# Patient Record
Sex: Female | Born: 1965 | Race: Black or African American | Hispanic: No | Marital: Married | Smoking: Never smoker
Health system: Southern US, Community
[De-identification: ages and names within clinical notes are randomized; demographics above are authoritative.]

## PROBLEM LIST (undated history)

## (undated) DIAGNOSIS — I10 Essential (primary) hypertension: Secondary | ICD-10-CM

## (undated) DIAGNOSIS — J301 Allergic rhinitis due to pollen: Secondary | ICD-10-CM

## (undated) DIAGNOSIS — E785 Hyperlipidemia, unspecified: Secondary | ICD-10-CM

## (undated) DIAGNOSIS — D649 Anemia, unspecified: Secondary | ICD-10-CM

## (undated) DIAGNOSIS — M1990 Unspecified osteoarthritis, unspecified site: Secondary | ICD-10-CM

## (undated) DIAGNOSIS — T7840XA Allergy, unspecified, initial encounter: Secondary | ICD-10-CM

## (undated) DIAGNOSIS — F32A Depression, unspecified: Secondary | ICD-10-CM

## (undated) DIAGNOSIS — N289 Disorder of kidney and ureter, unspecified: Secondary | ICD-10-CM

## (undated) DIAGNOSIS — K219 Gastro-esophageal reflux disease without esophagitis: Secondary | ICD-10-CM

## (undated) DIAGNOSIS — E7800 Pure hypercholesterolemia, unspecified: Secondary | ICD-10-CM

## (undated) DIAGNOSIS — B019 Varicella without complication: Secondary | ICD-10-CM

## (undated) HISTORY — DX: Allergy, unspecified, initial encounter: T78.40XA

## (undated) HISTORY — DX: Depression, unspecified: F32.A

## (undated) HISTORY — DX: Pure hypercholesterolemia, unspecified: E78.00

## (undated) HISTORY — DX: Essential (primary) hypertension: I10

## (undated) HISTORY — DX: Hyperlipidemia, unspecified: E78.5

## (undated) HISTORY — DX: Varicella without complication: B01.9

## (undated) HISTORY — DX: Gastro-esophageal reflux disease without esophagitis: K21.9

## (undated) HISTORY — DX: Disorder of kidney and ureter, unspecified: N28.9

## (undated) HISTORY — DX: Allergic rhinitis due to pollen: J30.1

## (undated) HISTORY — DX: Unspecified osteoarthritis, unspecified site: M19.90

## (undated) HISTORY — DX: Anemia, unspecified: D64.9

---

## 2002-09-11 ENCOUNTER — Ambulatory Visit (HOSPITAL_COMMUNITY): Admission: RE | Admit: 2002-09-11 | Discharge: 2002-09-11 | Payer: Self-pay | Admitting: Obstetrics

## 2002-09-11 ENCOUNTER — Encounter: Payer: Self-pay | Admitting: Obstetrics

## 2005-08-29 HISTORY — PX: ABDOMINAL HYSTERECTOMY: SHX81

## 2005-12-29 ENCOUNTER — Inpatient Hospital Stay (HOSPITAL_COMMUNITY): Admission: RE | Admit: 2005-12-29 | Discharge: 2005-12-31 | Payer: Self-pay | Admitting: Obstetrics

## 2009-08-29 DIAGNOSIS — N289 Disorder of kidney and ureter, unspecified: Secondary | ICD-10-CM

## 2009-08-29 HISTORY — DX: Disorder of kidney and ureter, unspecified: N28.9

## 2010-12-03 DIAGNOSIS — I10 Essential (primary) hypertension: Secondary | ICD-10-CM | POA: Insufficient documentation

## 2010-12-03 DIAGNOSIS — N183 Chronic kidney disease, stage 3 unspecified: Secondary | ICD-10-CM | POA: Insufficient documentation

## 2012-12-07 DIAGNOSIS — R1013 Epigastric pain: Secondary | ICD-10-CM | POA: Insufficient documentation

## 2014-05-09 ENCOUNTER — Encounter: Payer: Self-pay | Admitting: Gynecology

## 2014-05-16 ENCOUNTER — Encounter: Payer: Self-pay | Admitting: Gynecology

## 2014-06-04 ENCOUNTER — Encounter: Payer: Self-pay | Admitting: Gynecology

## 2014-06-04 ENCOUNTER — Ambulatory Visit (INDEPENDENT_AMBULATORY_CARE_PROVIDER_SITE_OTHER): Payer: BC Managed Care – PPO | Admitting: Gynecology

## 2014-06-04 VITALS — BP 126/64 | HR 100 | Resp 16 | Ht 62.25 in | Wt 190.0 lb

## 2014-06-04 DIAGNOSIS — N898 Other specified noninflammatory disorders of vagina: Secondary | ICD-10-CM

## 2014-06-04 DIAGNOSIS — Z0000 Encounter for general adult medical examination without abnormal findings: Secondary | ICD-10-CM

## 2014-06-04 DIAGNOSIS — Z01419 Encounter for gynecological examination (general) (routine) without abnormal findings: Secondary | ICD-10-CM

## 2014-06-04 DIAGNOSIS — Q524 Other congenital malformations of vagina: Secondary | ICD-10-CM

## 2014-06-04 DIAGNOSIS — Q505 Embryonic cyst of broad ligament: Secondary | ICD-10-CM

## 2014-06-04 LAB — POCT WET PREP (WET MOUNT): Clue Cells Wet Prep Whiff POC: POSITIVE

## 2014-06-04 LAB — POCT URINALYSIS DIPSTICK
Bilirubin, UA: NEGATIVE
Blood, UA: NEGATIVE
Glucose, UA: NEGATIVE
Ketones, UA: NEGATIVE
Leukocytes, UA: NEGATIVE
Nitrite, UA: NEGATIVE
Protein, UA: NEGATIVE
Urobilinogen, UA: NEGATIVE
pH, UA: 5

## 2014-06-04 LAB — HEMOGLOBIN, FINGERSTICK: Hemoglobin, fingerstick: 12.6 g/dL (ref 12.0–16.0)

## 2014-06-04 MED ORDER — NONFORMULARY OR COMPOUNDED ITEM: Status: DC

## 2014-06-04 NOTE — Progress Notes (Signed)
48 y.o. Married [place] American female  G2P2 here for annual exam. Pt is currently sexually active.  Pt has a TAH years ago.  Pt reports a "cyst" at the opening of the vagina for years.  Pt also reports some d/c and itching.  Pt with recurrent BV in the past.  Pt no longer douches, rare tub baths.  No abnormal pap.  Pt denies hot flashes, night sweats.  Patient's last menstrual period was 11/27/2005.          Sexually active: Yes.    The current method of family planning is status post hysterectomy.    Exercising: No.  The patient does not participate in regular exercise at present. Last pap: 2007 neg Alcohol: No Tobacco: No BSE: Sometimes MMG: 2004 BIRADS1: neg    Health Maintenance  Topic Date Due  . Pap Smear  12/06/1984  . Tetanus/tdap  12/06/1985  . Influenza Vaccine  03/29/2014    Family History  Problem Relation Age of Onset  . Hypertension Mother   . Hypertension Father     There are no active problems to display for this patient.   Past Medical History  Diagnosis Date  . Kidney disease 2011  . Hypertension     Past Surgical History  Procedure Laterality Date  . Cesarean section    . Abdominal hysterectomy  2007    Allergies: Amlodipine; Doxazosin; and Lisinopril  Current Outpatient Prescriptions  Medication Sig Dispense Refill  . fexofenadine (ALLEGRA) 30 MG tablet Take 30 mg by mouth 2 (two) times daily.      Marland Kitchen losartan-hydrochlorothiazide (HYZAAR) 100-25 MG per tablet TAKE ONE TABLET BY MOUTH ONE TIME DAILY      . spironolactone (ALDACTONE) 50 MG tablet        No current facility-administered medications for this visit.    ROS: Pertinent items are noted in HPI.  Exam:    BP 126/64  Pulse 100  Resp 16  Ht 5' 2.25" (1.581 m)  Wt 190 lb (86.183 kg)  BMI 34.48 kg/m2  LMP 11/27/2005 Weight change: @WEIGHTCHANGE @ Last 3 height recordings:  Ht Readings from Last 3 Encounters:  06/04/14 5' 2.25" (1.581 m)   General appearance: alert, cooperative  and appears stated age Head: Normocephalic, without obvious abnormality, atraumatic Neck: no adenopathy, no carotid bruit, no JVD, supple, symmetrical, trachea midline and thyroid not enlarged, symmetric, no tenderness/mass/nodules Lungs: clear to auscultation bilaterally Breasts: normal appearance, no masses or tenderness Heart: regular rate and rhythm, S1, S2 normal, no murmur, click, rub or gallop Abdomen: soft, non-tender; bowel sounds normal; no masses,  no organomegaly Extremities: extremities normal, atraumatic, no cyanosis or edema Skin: Skin color, texture, turgor normal. No rashes or lesions Lymph nodes: Cervical, supraclavicular, and axillary nodes normal. no inguinal nodes palpated Neurologic: Grossly normal   Pelvic: External genitalia:  no lesions              Urethra: normal appearing urethra with no masses, tenderness or lesions              Bartholins and Skenes: Bartholin's, Urethra, Skene's normal                 Vagina: vaginal discharge - white and scant, right sided gartner duct cyst, mobile non-tender              Cervix: absent              Pap taken: No.        Bimanual Exam:  Uterus:  absent                                      Adnexa:    no masses                                      Rectovaginal: Confirms                                      Anus:  normal sphincter tone, no lesions       1. Laboratory exam ordered as part of routine general medical examination  - POCT Urinalysis Dipstick - Hemoglobin, fingerstick  2. Encounter for routine gynecological examination  mammogram stressed! counseled on breast self exam, mammography screening, feminine hygiene, adequate intake of calcium and vitamin D, diet and exercise return annually or prn  3. Vaginal discharge Informed no clue cells but many epithelials noted,  1x vinegar douche then boric acid capsules pv F/u 2w for test of cure - POCT Wet Prep [facility] [place]) - NONFORMULARY OR COMPOUNDED ITEM; 1 per  vagina nightly every 3d, or after sex  Dispense: 61 each; Refill: 0  4. Gartner duct, cyst Pt informed, nature of cyst reviewed Not bothersome, recommend not removing   An After Visit Summary was printed and given to the patient.

## 2014-06-05 ENCOUNTER — Other Ambulatory Visit: Payer: Self-pay | Admitting: Gynecology

## 2014-06-05 ENCOUNTER — Telehealth: Payer: Self-pay

## 2014-06-05 DIAGNOSIS — Z1231 Encounter for screening mammogram for malignant neoplasm of breast: Secondary | ICD-10-CM

## 2014-06-05 NOTE — Telephone Encounter (Signed)
Spoke with patient. Patient states that Dr.Lathrop already sent rx to [facility] yesterday and she has picked up the rx.  Routing to provider for final review. Patient agreeable to disposition. Will close encounter

## 2014-06-05 NOTE — Telephone Encounter (Signed)
Spoke with [place] at time of incoming call. They state that electronic rx sent for patient does not specify what medication. Advised patient will need Boric acid capsules. [facility] in [place] is not a Oncologist. States the closest one is in [place] and is called [facility]. Will need to speak with patient and see if she wants rx sent to [facility] or compounding pharmacy in [place].

## 2014-06-05 NOTE — Telephone Encounter (Signed)
Pt is calling kaitlyn back

## 2014-06-05 NOTE — Telephone Encounter (Signed)
Left message to call Kaitlyn at [phone]. 

## 2014-06-20 ENCOUNTER — Ambulatory Visit (INDEPENDENT_AMBULATORY_CARE_PROVIDER_SITE_OTHER): Payer: BC Managed Care – PPO | Admitting: Gynecology

## 2014-06-20 ENCOUNTER — Ambulatory Visit (HOSPITAL_COMMUNITY)
Admission: RE | Admit: 2014-06-20 | Discharge: 2014-06-20 | Disposition: A | Discharge status: Home / Self Care | Payer: BC Managed Care – PPO | Source: Ambulatory Visit | Attending: Gynecology | Admitting: Gynecology

## 2014-06-20 ENCOUNTER — Encounter: Payer: Self-pay | Admitting: Gynecology

## 2014-06-20 VITALS — BP 112/72 | HR 68 | Resp 16 | Ht 62.25 in | Wt 192.0 lb

## 2014-06-20 DIAGNOSIS — Z1231 Encounter for screening mammogram for malignant neoplasm of breast: Secondary | ICD-10-CM | POA: Diagnosis not present

## 2014-06-20 DIAGNOSIS — N898 Other specified noninflammatory disorders of vagina: Secondary | ICD-10-CM

## 2014-06-20 MED ORDER — NONFORMULARY OR COMPOUNDED ITEM: Status: DC

## 2014-06-20 NOTE — Progress Notes (Signed)
Subjective:     Patient ID: Carol [name], female   DOB: 1966/07/18, 48 y.o.   MRN: [id number]  HPI Comments: Pt here for test of cure for vaginal discharge,  She was asked to do 1x vinegar douche and then start boric acid.  Pt states that she no longer has any discharge.  She has not had sex since starting medication    Review of Systems Per HPI    Objective:   Physical Exam  Nursing note and vitals reviewed. Constitutional: She appears well-developed and well-nourished.  Genitourinary: There is lesion (garner duct cyst) on the right labia. No erythema around the vagina. No vaginal discharge found.  No vaginal discharge Ph 4.5 Whiff negative Insufficient discharge for full wet prep       Assessment:     Recurrent BV     Plan:     Doing well with current management Refills of Boric acid sent in as needed, will see if local [facility] can make Questions addressed

## 2014-06-30 ENCOUNTER — Encounter: Payer: Self-pay | Admitting: Gynecology

## 2015-02-18 ENCOUNTER — Telehealth: Payer: Self-pay

## 2015-02-18 NOTE — Telephone Encounter (Signed)
Error.  Misunderstood patient on phone. Opened Wrong Chart.

## 2015-05-26 ENCOUNTER — Other Ambulatory Visit: Payer: Self-pay

## 2015-05-26 DIAGNOSIS — Z1231 Encounter for screening mammogram for malignant neoplasm of breast: Secondary | ICD-10-CM

## 2015-06-24 ENCOUNTER — Encounter: Payer: Self-pay | Admitting: Obstetrics and Gynecology

## 2015-06-24 ENCOUNTER — Ambulatory Visit (INDEPENDENT_AMBULATORY_CARE_PROVIDER_SITE_OTHER): Payer: BLUE CROSS/BLUE SHIELD | Admitting: Obstetrics and Gynecology

## 2015-06-24 ENCOUNTER — Ambulatory Visit
Admission: RE | Admit: 2015-06-24 | Discharge: 2015-06-24 | Disposition: A | Discharge status: Home / Self Care | Payer: BLUE CROSS/BLUE SHIELD | Source: Ambulatory Visit

## 2015-06-24 VITALS — BP 128/80 | HR 84 | Resp 14 | Ht 62.5 in | Wt 190.0 lb

## 2015-06-24 DIAGNOSIS — Z01419 Encounter for gynecological examination (general) (routine) without abnormal findings: Secondary | ICD-10-CM

## 2015-06-24 DIAGNOSIS — Z23 Encounter for immunization: Secondary | ICD-10-CM

## 2015-06-24 DIAGNOSIS — Z1231 Encounter for screening mammogram for malignant neoplasm of breast: Secondary | ICD-10-CM

## 2015-06-24 DIAGNOSIS — N898 Other specified noninflammatory disorders of vagina: Secondary | ICD-10-CM

## 2015-06-24 NOTE — Patient Instructions (Signed)

## 2015-06-24 NOTE — Progress Notes (Signed)
Patient ID: Carol [name], female   DOB: 10-21-1965, 49 y.o.   MRN: [id number] 49 y.o. [id number] MarriedAfrican AmericanF here for annual exam.  H/O TAH. Every now and then she notices a watery d/c, she has been using haylafem intermittently, most of the time 2 x a week. No itching, burning or irritation. Occasional mild odor, musty. She feels intermittently bloated. BM mostly 1 x a day, can go 2 days in between BM's. No vaginal bleeding. Sexually active, no pain. Occasional vaginal dryness. Occasionally hot at night, no hot flashes or night sweats.     Patient's last menstrual period was 11/27/2005.          Sexually active: Yes.    The current method of family planning is status post hysterectomy.    Exercising: Yes.    walking Smoker:  no  Health Maintenance: Pap:  2007 WNL History of abnormal Pap:  no MMG:  06-20-14 WNL  Colonoscopy:  Never BMD:   Never TDaP:  unsure Gardasil: N/A   reports that she has never smoked. She has never used smokeless tobacco. She reports that she does not drink alcohol or use illicit drugs.  Past Medical History  Diagnosis Date  . Kidney disease 2011  . Hypertension   She see's a nephrologist  Past Surgical History  Procedure Laterality Date  . Cesarean section    . Abdominal hysterectomy  2007    Current Outpatient Prescriptions  Medication Sig Dispense Refill  . fexofenadine (ALLEGRA) 30 MG tablet Take 30 mg by mouth as needed.     Marland Kitchen losartan-hydrochlorothiazide (HYZAAR) 100-25 MG per tablet TAKE ONE TABLET BY MOUTH ONE TIME DAILY    . NONFORMULARY OR COMPOUNDED ITEM 1 per vagina nightly every 3d, or after sex 31 each 10  . spironolactone (ALDACTONE) 50 MG tablet      No current facility-administered medications for this visit.    Family History  Problem Relation Age of Onset  . Hypertension Mother   . Hypertension Father     Review of Systems  Constitutional: Positive for chills and unexpected weight change.  HENT: Positive for sinus  pressure.   Eyes: Negative.   Respiratory: Negative.   Cardiovascular: Negative.   Gastrointestinal: Negative.        Bloating  Endocrine: Negative.   Genitourinary: Negative.   Musculoskeletal: Negative.   Skin: Negative.   Allergic/Immunologic: Negative.   Neurological: Negative.   Psychiatric/Behavioral: Negative.   Error above, she has gained a couple of pounds. Occasionally gets chilly  Exam:   BP 128/80 mmHg  Pulse 84  Resp 14  Ht 5' 2.5" (1.588 m)  Wt 190 lb (86.183 kg)  BMI 34.18 kg/m2  LMP 11/27/2005  Weight change: @WEIGHTCHANGE @ Height:   Height: 5' 2.5" (158.8 cm)  Ht Readings from Last 3 Encounters:  06/24/15 5' 2.5" (1.588 m)  06/20/14 5' 2.25" (1.581 m)  06/04/14 5' 2.25" (1.581 m)    General appearance: alert, cooperative and appears stated age Head: Normocephalic, without obvious abnormality, atraumatic Neck: no adenopathy, supple, symmetrical, trachea midline and thyroid normal to inspection and palpation Lungs: clear to auscultation bilaterally Breasts: normal appearance, no masses or tenderness Heart: regular rate and rhythm Abdomen: soft, non-tender; bowel sounds normal; no masses,  no organomegaly Extremities: extremities normal, atraumatic, no cyanosis or edema Skin: Skin color, texture, turgor normal. No rashes or lesions Lymph nodes: Cervical, supraclavicular, and axillary nodes normal. No abnormal inguinal nodes palpated Neurologic: Grossly normal   Pelvic: External genitalia:  no  lesions              Urethra:  normal appearing urethra with no masses, tenderness or lesions              Bartholins and Skenes: normal                 Vagina: normal appearing, slightly dry, still appears estrogenized, some thick white d/c on the wall of the vagina, may be                Cervix: absent               Bimanual Exam:  Uterus:  uterus absent              Adnexa: no mass, fullness, tenderness               Rectovaginal: Confirms               Anus:   normal sphincter tone, no lesions  Chaperone was present for exam.  Wet prep: no clue, no trich, few WBC, few para basilar cells KOH: negative for yeast PH: 4  A:  Well Woman with normal exam  Intermittent vaginal d/c, using hylafem off and on for the last year  Immunization: TDAP due  P:   No pap needed  Mammogram scheduled next week  Negative vaginal slides  Wet prep probe sent  Advised to stop using the hylafem and come in with symptoms  TDAP today

## 2015-06-25 ENCOUNTER — Telehealth: Payer: Self-pay | Admitting: Emergency Medicine

## 2015-06-25 LAB — WET PREP BY MOLECULAR PROBE
Candida species: NEGATIVE
GARDNERELLA VAGINALIS: NEGATIVE
Trichomonas vaginosis: NEGATIVE

## 2015-06-25 NOTE — Telephone Encounter (Signed)
-----   Message from Salvadore Dom, MD sent at 06/24/2015  3:49 PM EDT ----- Please check the results of her wet prep probe tomorrow. Thanks!!

## 2015-06-25 NOTE — Telephone Encounter (Signed)
Message left to return call to [place] at [phone].   Affirm negative.

## 2015-06-25 NOTE — Telephone Encounter (Signed)
Call to patient to advise of negative wet prep affirm testing. Patient states she will follow instructions from Dr. Talbert Nan and monitor symptoms and return call as needed. Routing to provider for final review. Patient agreeable to disposition. Will close encounter.

## 2015-06-26 ENCOUNTER — Telehealth: Payer: Self-pay

## 2015-06-26 NOTE — Telephone Encounter (Signed)
-----   Message from Nunzio Cobbs, MD sent at 06/26/2015  5:50 AM EDT ----- Please inform of negative Affirm testing.  Dr. Talbert Nan has advised to stop doing scheduled Hylafem and please call the office for an appointment if she having symptoms of vaginitis.

## 2015-06-26 NOTE — Telephone Encounter (Signed)
Michele Mcalpine, RN at 06/25/2015 4:14 PM     Status: Signed       Expand All Collapse All   Call to patient to advise of negative wet prep affirm testing. Patient states she will follow instructions from Dr. Talbert Nan and monitor symptoms and return call as needed. Routing to provider for final review. Patient agreeable to disposition. Will close encounter.                Michele Mcalpine, RN at 06/25/2015 10:30 AM     Status: Signed       Expand All Collapse All   Message left to return call to [place] at [phone].   Affirm negative.              Michele Mcalpine, RN at 06/25/2015 10:29 AM     Status: Signed       Expand All Collapse All   ----- Message from Salvadore Dom, MD sent at 06/24/2015 3:49 PM EDT ----- Please check the results of her wet prep probe tomorrow. Thanks!!       Routing to provider for final review. Patient agreeable to disposition. Will close encounter.

## 2015-06-30 ENCOUNTER — Other Ambulatory Visit: Payer: Self-pay | Admitting: Obstetrics and Gynecology

## 2015-06-30 DIAGNOSIS — R928 Other abnormal and inconclusive findings on diagnostic imaging of breast: Secondary | ICD-10-CM

## 2015-07-06 ENCOUNTER — Ambulatory Visit
Admission: RE | Admit: 2015-07-06 | Discharge: 2015-07-06 | Disposition: A | Discharge status: Home / Self Care | Payer: BLUE CROSS/BLUE SHIELD | Source: Ambulatory Visit | Attending: Obstetrics and Gynecology | Admitting: Obstetrics and Gynecology

## 2015-07-06 DIAGNOSIS — R928 Other abnormal and inconclusive findings on diagnostic imaging of breast: Secondary | ICD-10-CM

## 2015-08-27 ENCOUNTER — Ambulatory Visit (INDEPENDENT_AMBULATORY_CARE_PROVIDER_SITE_OTHER): Payer: BLUE CROSS/BLUE SHIELD | Admitting: Obstetrics and Gynecology

## 2015-08-27 ENCOUNTER — Encounter: Payer: Self-pay | Admitting: Obstetrics and Gynecology

## 2015-08-27 VITALS — BP 122/80 | HR 88 | Resp 16 | Wt 192.0 lb

## 2015-08-27 DIAGNOSIS — N898 Other specified noninflammatory disorders of vagina: Secondary | ICD-10-CM

## 2015-08-27 DIAGNOSIS — L293 Anogenital pruritus, unspecified: Secondary | ICD-10-CM | POA: Diagnosis not present

## 2015-08-27 NOTE — Progress Notes (Signed)
Patient ID: Carol [name], female   DOB: 07/11/1966, 49 y.o.   MRN: [id number] GYNECOLOGY  VISIT   HPI: 49 y.o.   Married  [place] American  female   [phone] with Patient's last menstrual period was 11/27/2005.   here c/o vaginal itching and discharge for a few weeks, may be getting slightly better. Itching is mild. No burning. Discharge is thick but can be watery, clear. Possible mild odor.   GYNECOLOGIC HISTORY: Patient's last menstrual period was 11/27/2005. Contraception:Hysterectomy  Menopausal hormone therapy: None         OB History    Gravida Para Term Preterm AB TAB SAB Ectopic Multiple Living   2 2 2       2          There are no active problems to display for this patient.   Past Medical History  Diagnosis Date  . Kidney disease 2011  . Hypertension     Past Surgical History  Procedure Laterality Date  . Cesarean section    . Abdominal hysterectomy  2007    Current Outpatient Prescriptions  Medication Sig Dispense Refill  . fexofenadine (ALLEGRA) 30 MG tablet Take 30 mg by mouth as needed.     Marland Kitchen losartan-hydrochlorothiazide (HYZAAR) 100-25 MG per tablet TAKE ONE TABLET BY MOUTH ONE TIME DAILY    . spironolactone (ALDACTONE) 50 MG tablet     . NONFORMULARY OR COMPOUNDED ITEM 1 per vagina nightly every 3d, or after sex (Patient not taking: Reported on 08/27/2015) 30 each 10   No current facility-administered medications for this visit.     ALLERGIES: Amlodipine; Doxazosin; and Lisinopril  Family History  Problem Relation Age of Onset  . Hypertension Mother   . Hypertension Father     Social History   Social History  . Marital Status: Married    Spouse Name: N/A  . Number of Children: N/A  . Years of Education: N/A   Occupational History  . Not on file.   Social History Main Topics  . Smoking status: Never Smoker   . Smokeless tobacco: Never Used  . Alcohol Use: No  . Drug Use: No  . Sexual Activity:    Partners: Male    Birth Control/  Protection: Surgical   Other Topics Concern  . Not on file   Social History Narrative    Review of Systems  Constitutional: Negative.   HENT: Negative.   Eyes: Negative.   Respiratory: Negative.   Cardiovascular: Negative.   Gastrointestinal: Negative.   Genitourinary:       Vaginal discharge and itching   Musculoskeletal: Negative.   Skin: Negative.   Neurological: Negative.   Endo/Heme/Allergies: Negative.   Psychiatric/Behavioral: Negative.     PHYSICAL EXAMINATION:    BP 122/80 mmHg  Pulse 88  Resp 16  Wt 192 lb (87.091 kg)  LMP 11/27/2005    General appearance: alert, cooperative and appears stated age  Pelvic: External genitalia:  no lesions, mild erythema              Urethra:  normal appearing urethra with no masses, tenderness or lesions              Bartholins and Skenes: normal                 Vagina: normal appearing vagina with a thick, clumpy white vaginal discharge.               Cervix: no lesions  Chaperone was present for exam.   Wet prep: ? clue, no trich, rare wbc KOH: no yeast PH: 5   ASSESSMENT Vaginal discharge Genital pruritus Vaginal slides not conclusive, concerning for BV, exam more cw yeast    PLAN Send wet prep probe, will treat accordingly   An After Visit Summary was printed and given to the patient.

## 2015-08-27 NOTE — Patient Instructions (Signed)

## 2015-08-28 ENCOUNTER — Other Ambulatory Visit: Payer: Self-pay | Admitting: Obstetrics & Gynecology

## 2015-08-28 LAB — WET PREP BY MOLECULAR PROBE
CANDIDA SPECIES: NEGATIVE
Gardnerella vaginalis: POSITIVE — AB
Trichomonas vaginosis: NEGATIVE

## 2015-08-28 MED ORDER — METRONIDAZOLE 500 MG PO TABS: 500.0000 mg | ORAL_TABLET | Freq: Two times a day (BID) | ORAL | Status: DC

## 2015-10-20 ENCOUNTER — Other Ambulatory Visit: Payer: Self-pay | Admitting: Obstetrics & Gynecology

## 2015-10-21 NOTE — Telephone Encounter (Signed)
Please have the patient come in for evaluation. If we document 3 cases of BV we would treat her with suppression. Of her last 2 wet prep probes for symptoms, only one was + for BV.

## 2015-10-21 NOTE — Telephone Encounter (Signed)
Medication refill request: Flagyl Last AEX:  06/24/15 JJ Next AEX: 06/27/16 JJ Last MMG (if hormonal medication request): 07/06/15 BIRADS Category 2 Benign Refill authorized: 08/28/15 #14 tabs 0 Refills  Today: #14 tabs 0 Refills ? Please advise

## 2015-10-22 ENCOUNTER — Telehealth: Payer: Self-pay | Admitting: Emergency Medicine

## 2015-10-22 NOTE — Telephone Encounter (Signed)
Message left to return call to Korbyn Vanes at [phone].    

## 2015-10-22 NOTE — Telephone Encounter (Signed)
Message left to return call to Carol [name] at [phone].    

## 2015-10-28 NOTE — Telephone Encounter (Signed)
Patient returned call. She requested treatment via pharmacy request for bacterial vaginosis. Patient states she has intermittent vaginal odor that has started when she has started working out at [employer]. It is not constant and there is thin clear vaginal discharge. Patient feels this is not any different than previous bacterial vaginosis infections.  Denies fevers or pelvic pain. No STD concerns.  Patient offered office visit with Dr. Talbert Nan for evaluation and she declines. She states she will call back if she chooses to schedule an office visit.  She is advised to call back with any concerns and is agreeable. Routing to provider for final review. Patient agreeable to disposition. Will close encounter.

## 2015-11-02 ENCOUNTER — Ambulatory Visit (INDEPENDENT_AMBULATORY_CARE_PROVIDER_SITE_OTHER): Payer: BLUE CROSS/BLUE SHIELD | Admitting: Obstetrics and Gynecology

## 2015-11-02 ENCOUNTER — Encounter: Payer: Self-pay | Admitting: Obstetrics and Gynecology

## 2015-11-02 VITALS — BP 124/72 | HR 88 | Resp 14 | Wt 188.0 lb

## 2015-11-02 DIAGNOSIS — N9489 Other specified conditions associated with female genital organs and menstrual cycle: Secondary | ICD-10-CM | POA: Diagnosis not present

## 2015-11-02 DIAGNOSIS — N898 Other specified noninflammatory disorders of vagina: Secondary | ICD-10-CM

## 2015-11-02 NOTE — Progress Notes (Signed)
Patient ID: Carol [name], female   DOB: Jan 11, 1966, 50 y.o.   MRN: [id number] GYNECOLOGY  VISIT   HPI: 50 y.o.   Married  [place] American  female   [phone] with Patient's last menstrual period was 11/27/2005.   here c/o vaginal discharge and slight odor for the last 3 weeks. The D/C is an increased clear d/c. She notices the odor more if she works out. She is sexually active, not sure the odor is worse after intercourse.  The patient was treated for BV 2 months ago. t  GYNECOLOGIC HISTORY: Patient's last menstrual period was 11/27/2005. Contraception:postmenopause Menopausal hormone therapy: none         OB History    Gravida Para Term Preterm AB TAB SAB Ectopic Multiple Living   2 2 2       2          There are no active problems to display for this patient.   Past Medical History  Diagnosis Date  . Kidney disease 2011  . Hypertension     Past Surgical History  Procedure Laterality Date  . Cesarean section    . Abdominal hysterectomy  2007    Current Outpatient Prescriptions  Medication Sig Dispense Refill  . fexofenadine (ALLEGRA) 30 MG tablet Take 30 mg by mouth as needed.     Marland Kitchen losartan-hydrochlorothiazide (HYZAAR) 100-25 MG per tablet TAKE ONE TABLET BY MOUTH ONE TIME DAILY    . spironolactone (ALDACTONE) 50 MG tablet      No current facility-administered medications for this visit.     ALLERGIES: Amlodipine; Doxazosin; and Lisinopril  Family History  Problem Relation Age of Onset  . Hypertension Mother   . Hypertension Father     Social History   Social History  . Marital Status: Married    Spouse Name: N/A  . Number of Children: N/A  . Years of Education: N/A   Occupational History  . Not on file.   Social History Main Topics  . Smoking status: Never Smoker   . Smokeless tobacco: Never Used  . Alcohol Use: No  . Drug Use: No  . Sexual Activity:    Partners: Male    Birth Control/ Protection: Surgical   Other Topics Concern  . Not on file    Social History Narrative    Review of Systems  Constitutional: Negative.   HENT: Negative.   Eyes: Negative.   Respiratory: Negative.   Cardiovascular: Negative.   Gastrointestinal: Negative.   Genitourinary:       Vaginal discharge Odor Irritation   Musculoskeletal: Negative.   Skin: Negative.   Neurological: Negative.   Endo/Heme/Allergies: Negative.   Psychiatric/Behavioral: Negative.     PHYSICAL EXAMINATION:    BP 124/72 mmHg  Pulse 88  Resp 14  Wt 188 lb (85.276 kg)  LMP 11/27/2005    General appearance: alert, cooperative and appears stated age  Pelvic: External genitalia:  no lesions              Urethra:  normal appearing urethra with no masses, tenderness or lesions              Bartholins and Skenes: normal                 Vagina: normal appearing vagina with normal color and discharge, no lesions              Cervix:absent  Chaperone was present for exam.  Wet prep: ? clue, no trich, ++ wbc KOH:  no yeast PH: 4  ASSESSMENT Increased vaginal d/c with an odor. Vaginal  Slides are unclear  PLAN Wet prep probe sent Will treat accordingly Discussed that condoms can decrease the risk of BV We discussed that some women have more vaginal d/c than others Patient requested a list of names of primary MD's, information given   An After Visit Summary was printed and given to the patient.  15 minutes face to face time of which over 50% was spent in counseling.

## 2015-11-03 ENCOUNTER — Telehealth: Payer: Self-pay

## 2015-11-03 LAB — WET PREP BY MOLECULAR PROBE
CANDIDA SPECIES: NEGATIVE
Gardnerella vaginalis: NEGATIVE
Trichomonas vaginosis: NEGATIVE

## 2015-11-03 NOTE — Telephone Encounter (Signed)
Spoke with patient. Advised of results as seen below from [place]. She verbalizes understanding.  Routing to provider for final review. Patient agreeable to disposition. Will close encounter.

## 2015-11-03 NOTE — Telephone Encounter (Signed)
-----   Message from Carol Dom, MD sent at 11/03/2015 12:55 PM EST ----- Please advise the patient of normal results.

## 2016-06-01 ENCOUNTER — Other Ambulatory Visit: Payer: Self-pay | Admitting: Obstetrics and Gynecology

## 2016-06-01 DIAGNOSIS — Z1231 Encounter for screening mammogram for malignant neoplasm of breast: Secondary | ICD-10-CM

## 2016-06-27 ENCOUNTER — Ambulatory Visit
Admission: RE | Admit: 2016-06-27 | Discharge: 2016-06-27 | Disposition: A | Discharge status: Home / Self Care | Payer: BLUE CROSS/BLUE SHIELD | Source: Ambulatory Visit | Attending: Obstetrics and Gynecology | Admitting: Obstetrics and Gynecology

## 2016-06-27 ENCOUNTER — Ambulatory Visit (INDEPENDENT_AMBULATORY_CARE_PROVIDER_SITE_OTHER): Payer: BLUE CROSS/BLUE SHIELD | Admitting: Obstetrics and Gynecology

## 2016-06-27 ENCOUNTER — Encounter: Payer: Self-pay | Admitting: Obstetrics and Gynecology

## 2016-06-27 VITALS — BP 148/80 | HR 84 | Resp 14 | Ht 62.25 in | Wt 190.0 lb

## 2016-06-27 DIAGNOSIS — B372 Candidiasis of skin and nail: Secondary | ICD-10-CM | POA: Diagnosis not present

## 2016-06-27 DIAGNOSIS — R194 Change in bowel habit: Secondary | ICD-10-CM

## 2016-06-27 DIAGNOSIS — R350 Frequency of micturition: Secondary | ICD-10-CM | POA: Diagnosis not present

## 2016-06-27 DIAGNOSIS — Z0000 Encounter for general adult medical examination without abnormal findings: Secondary | ICD-10-CM | POA: Diagnosis not present

## 2016-06-27 DIAGNOSIS — Z1231 Encounter for screening mammogram for malignant neoplasm of breast: Secondary | ICD-10-CM

## 2016-06-27 DIAGNOSIS — R1030 Lower abdominal pain, unspecified: Secondary | ICD-10-CM

## 2016-06-27 DIAGNOSIS — Z01419 Encounter for gynecological examination (general) (routine) without abnormal findings: Secondary | ICD-10-CM | POA: Diagnosis not present

## 2016-06-27 DIAGNOSIS — N898 Other specified noninflammatory disorders of vagina: Secondary | ICD-10-CM

## 2016-06-27 LAB — CBC
HEMATOCRIT: 35.4 % (ref 35.0–45.0)
HEMOGLOBIN: 11.5 g/dL — AB (ref 11.7–15.5)
MCH: 23.3 pg — ABNORMAL LOW (ref 27.0–33.0)
MCHC: 32.5 g/dL (ref 32.0–36.0)
MCV: 71.8 fL — ABNORMAL LOW (ref 80.0–100.0)
MPV: 9.4 fL (ref 7.5–12.5)
Platelets: 394 10*3/uL (ref 140–400)
RBC: 4.93 MIL/uL (ref 3.80–5.10)
RDW: 15.4 % — ABNORMAL HIGH (ref 11.0–15.0)
WBC: 10.8 10*3/uL (ref 3.8–10.8)

## 2016-06-27 LAB — POCT URINALYSIS DIPSTICK
BILIRUBIN UA: NEGATIVE
Blood, UA: NEGATIVE
Glucose, UA: NEGATIVE
KETONES UA: NEGATIVE
LEUKOCYTES UA: NEGATIVE
Nitrite, UA: NEGATIVE
Protein, UA: NEGATIVE
Urobilinogen, UA: NEGATIVE
pH, UA: 6

## 2016-06-27 LAB — COMPREHENSIVE METABOLIC PANEL
ALBUMIN: 3.9 g/dL (ref 3.6–5.1)
ALK PHOS: 55 U/L (ref 33–115)
ALT: 14 U/L (ref 6–29)
AST: 14 U/L (ref 10–35)
BILIRUBIN TOTAL: 0.3 mg/dL (ref 0.2–1.2)
BUN: 12 mg/dL (ref 7–25)
CALCIUM: 9.7 mg/dL (ref 8.6–10.2)
CO2: 26 mmol/L (ref 20–31)
CREATININE: 1.15 mg/dL — AB (ref 0.50–1.10)
Chloride: 102 mmol/L (ref 98–110)
Glucose, Bld: 85 mg/dL (ref 65–99)
Potassium: 3.8 mmol/L (ref 3.5–5.3)
Sodium: 137 mmol/L (ref 135–146)
TOTAL PROTEIN: 7 g/dL (ref 6.1–8.1)

## 2016-06-27 LAB — LIPID PANEL
Cholesterol: 188 mg/dL (ref 125–200)
HDL: 30 mg/dL — AB (ref >=46)
LDL Cholesterol: 128 mg/dL (ref <130)
TRIGLYCERIDES: 148 mg/dL (ref <150)
Total CHOL/HDL Ratio: 6.3 Ratio — ABNORMAL HIGH (ref <=5.0)
VLDL: 30 mg/dL (ref <30)

## 2016-06-27 MED ORDER — NYSTATIN 100000 UNIT/GM EX CREA: 1.0000 "application " | TOPICAL_CREAM | Freq: Two times a day (BID) | CUTANEOUS | 0 refills | Status: DC

## 2016-06-27 NOTE — Progress Notes (Signed)
50 y.o. [id number] MarriedAfrican AmericanF here for annual exam. She c/o intermittent BLQ pain/pressure for the last week. Crampy sensation, up to a 7/10 in severity. Ibuprofen helps. The discomfort lasts for hours at a time. Slight nausea. She has irregular BM, she can have 2 in a day or go 2 days without one. Can go from soft to diet. Pain improves a little with BM. No fevers, no nausea, emesis. Some urinary frequency, no urgency. No pain.  Sexually active, no pain. Some dryness, not using a lubricant.   She c/o itching in her groin for the last 2 weeks, improving, but still there.  Occasional vaginal odor.     Patient's last menstrual period was 11/27/2005.          Sexually active: Yes.    The current method of family planning is status post hysterectomy.    Exercising: No.  The patient does not participate in regular exercise at present. Smoker:  no  Health Maintenance: Pap:  2007 NEG  History of abnormal Pap:  no MMG:  07-06-15 left breast U/s WNL  Colonoscopy:  Never BMD:   Never TDaP:  06-24-15 Gardasil: N/A   reports that she has never smoked. She has never used smokeless tobacco. She reports that she does not drink alcohol or use drugs. Rare ETOH. She is a Regulatory affairs officer. Daughters are grown, one is a Quarry manager, the other is a CMA. Both live at home.   Past Medical History:  Diagnosis Date  . Hypertension   . Kidney disease 2011    Past Surgical History:  Procedure Laterality Date  . ABDOMINAL HYSTERECTOMY  2007  . CESAREAN SECTION      Current Outpatient Prescriptions  Medication Sig Dispense Refill  . fexofenadine (ALLEGRA) 30 MG tablet Take 30 mg by mouth as needed.     Marland Kitchen losartan-hydrochlorothiazide (HYZAAR) 100-25 MG per tablet TAKE ONE TABLET BY MOUTH ONE TIME DAILY    . spironolactone (ALDACTONE) 50 MG tablet      No current facility-administered medications for this visit.     Family History  Problem Relation Age of Onset  . Hypertension Mother   . Hypertension  Father     Review of Systems  Constitutional: Positive for chills.  HENT: Negative.   Eyes: Negative.   Respiratory: Negative.   Cardiovascular: Negative.   Gastrointestinal: Positive for nausea.  Endocrine: Negative.   Genitourinary: Positive for frequency and vaginal discharge.  Musculoskeletal: Positive for joint swelling and myalgias.  Skin: Negative.   Allergic/Immunologic: Negative.   Neurological: Negative.   Psychiatric/Behavioral: Negative.     Exam:   BP (!) 148/80 (BP Location: Right Arm, Patient Position: Sitting, Cuff Size: Normal)   Pulse 84   Resp 14   Ht 5' 2.25" (1.581 m)   Wt 190 lb (86.2 kg)   LMP 11/27/2005   BMI 34.47 kg/m   Weight change: @WEIGHTCHANGE @ Height:   Height: 5' 2.25" (158.1 cm)  Ht Readings from Last 3 Encounters:  06/27/16 5' 2.25" (1.581 m)  06/24/15 5' 2.5" (1.588 m)  06/20/14 5' 2.25" (1.581 m)    General appearance: alert, cooperative and appears stated age Head: Normocephalic, without obvious abnormality, atraumatic Neck: no adenopathy, supple, symmetrical, trachea midline and thyroid normal to inspection and palpation Lungs: clear to auscultation bilaterally Breasts: normal appearance, no masses or tenderness, bilateral fibrocystic changes (symmetric) Heart: regular rate and rhythm Abdomen: soft, mildly tender BLQ, no rebound, no guarding, bowel sounds normal; no masses,  no organomegaly Extremities: extremities  normal, atraumatic, no cyanosis or edema Skin: Skin color, texture, turgor normal. Slight increased erythema in the left groin and under her panus, suspect candida intertrigo. Multiple moles seen (recommended she f/u with dermatology) Lymph nodes: Cervical, supraclavicular, and axillary nodes normal. No abnormal inguinal nodes palpated Neurologic: Grossly normal   Pelvic: External genitalia:  no lesions              Urethra:  normal appearing urethra with no masses, tenderness or lesions              Bartholins and  Skenes: normal                 Vagina: normal appearing vagina with normal color and discharge, no lesions              Cervix: absent               Bimanual Exam:  Uterus:  uterus absent              Adnexa: no mass, fullness, tenderness               Rectovaginal: Confirms               Anus:  normal sphincter tone, no lesions  Chaperone was present for exam.  A:  Well Woman with normal exam  Abdominal pain, suspect GI, normal pelvic exam.   Urinary frequency, negative dip  Candida intertrigo  Vaginal odor   P:   Some labs with her nephrologist  CBC, CMP, Vit D, Lipids  No pap needed  Will send urine for culture  Calendar abdominal pain, BM and diet  Discussed trying to eliminate dairy and then gluten to see if that helps  Try metamucil  If abdominal pain persists, will refer to GI   Wet prep probe

## 2016-06-27 NOTE — Patient Instructions (Signed)

## 2016-06-28 LAB — VITAMIN D 25 HYDROXY (VIT D DEFICIENCY, FRACTURES): VIT D 25 HYDROXY: 18 ng/mL — AB (ref 30–100)

## 2016-06-28 LAB — WET PREP BY MOLECULAR PROBE
CANDIDA SPECIES: NEGATIVE
GARDNERELLA VAGINALIS: NEGATIVE
TRICHOMONAS VAG: NEGATIVE

## 2016-06-28 LAB — URINE CULTURE: ORGANISM ID, BACTERIA: NO GROWTH

## 2016-06-30 ENCOUNTER — Telehealth: Payer: Self-pay | Admitting: *Deleted

## 2016-06-30 NOTE — Telephone Encounter (Signed)
Left voicemail to call back.  If I am not available please route to triage.

## 2016-06-30 NOTE — Telephone Encounter (Signed)
-----   Message from Salvadore Dom, MD sent at 06/29/2016  4:50 PM EDT ----- Please inform the patient that her urine culture was negative, her vaginitis panel was negative She has a h/o kidney disease and her creatinine is slightly elevated (not sure if this is a change). Please send a copy of her labs to her nephrologist.  Her vit D level is low, she should increase her current vit d intake by 2,000 IU a day and have a f/u vit d checked in 3 months She is slightly anemic (please check with her if this is a change) She also has an abnormal lipid panel, this increases her risk for heart disease. She should be on a diet low in saturated fats, exercise a minimum of 3 days a week and should establish care with a primary, unless her nephrologist will manage this for her.

## 2016-07-01 NOTE — Telephone Encounter (Signed)
Patient returned call. Lab results reviewed with patient and she verbalized understanding. Patient states that her creatinine is always slightly elevated and her hemoglobin is slightly anemic. Patient aware to increase vitamin d intake to 2000 IU daily. 3 month vitamin d recheck scheduled for Tuesday 09/27/16 at 1400. Patient agreeable to date and time of appointment. Patient states that she thinks her Nephrologist manages her lipid panel, but she also has a PCP- so she will check to see if PCP follows as well. Patient aware of the increase risk for heart disease and verbalized understanding of importance of well balanced diet and exercise.    Routing to provider for final review. Patient agreeable to disposition. Will close encounter.

## 2016-07-01 NOTE — Telephone Encounter (Signed)
Per Carol [name], she sent a copy of labs to Nephrologist already.   Routing to provider for final review. Patient agreeable to disposition. Will close encounter.

## 2016-09-27 ENCOUNTER — Other Ambulatory Visit (INDEPENDENT_AMBULATORY_CARE_PROVIDER_SITE_OTHER): Payer: BLUE CROSS/BLUE SHIELD

## 2016-09-27 DIAGNOSIS — E559 Vitamin D deficiency, unspecified: Secondary | ICD-10-CM

## 2016-09-28 ENCOUNTER — Telehealth: Payer: Self-pay | Admitting: *Deleted

## 2016-09-28 LAB — VITAMIN D 25 HYDROXY (VIT D DEFICIENCY, FRACTURES): Vit D, 25-Hydroxy: 39 ng/mL (ref 30–100)

## 2016-09-28 NOTE — Telephone Encounter (Signed)
-----   Message from Salvadore Dom, MD sent at 09/28/2016  9:29 AM EST ----- Please inform the patient that her vit d level is now in the normal range. She should drop down to 1,000 IU of vit D3 daily to maintain her vit d at a normal level (long term).

## 2016-09-28 NOTE — Telephone Encounter (Signed)
Left message to call regarding lab results -eh 

## 2016-09-30 NOTE — Telephone Encounter (Signed)
Patient returned call

## 2016-09-30 NOTE — Telephone Encounter (Signed)
Patient called back for lab results 

## 2016-10-03 NOTE — Telephone Encounter (Signed)
Left message to call regarding labs results -eh  

## 2016-10-04 LAB — BASIC METABOLIC PANEL
Creatinine: 1.2 — AB (ref <=1.1)
GLUCOSE: 117

## 2016-12-16 ENCOUNTER — Encounter: Payer: Self-pay | Admitting: Obstetrics & Gynecology

## 2016-12-16 ENCOUNTER — Ambulatory Visit (INDEPENDENT_AMBULATORY_CARE_PROVIDER_SITE_OTHER): Payer: BLUE CROSS/BLUE SHIELD | Admitting: Obstetrics & Gynecology

## 2016-12-16 VITALS — BP 110/70 | HR 92 | Temp 98.7°F | Resp 14 | Ht 62.25 in | Wt 189.0 lb

## 2016-12-16 DIAGNOSIS — N898 Other specified noninflammatory disorders of vagina: Secondary | ICD-10-CM

## 2016-12-16 MED ORDER — FLUCONAZOLE 150 MG PO TABS: ORAL_TABLET | ORAL | 1 refills | Status: DC

## 2016-12-16 NOTE — Progress Notes (Signed)
GYNECOLOGY  VISIT   HPI:  51 y.o. [id number] Married [place] American female here for complaint of vaginal discharge that has been going on for about two weeks.  She reports normally when she has a problem with discharge, it is typically BV.  This typically more malodorous and thickener than her current discharge.  Her current discharge is watery with minimal odor.  She it not having a lot of itching.    She does have vaginal dryness from time to time.  No new sex partner.    PCP:  [facility]  GYNECOLOGIC HISTORY: Patient's last menstrual period was 11/27/2005. Contraception: Hysterectomy  Menopausal hormone therapy: None   There are no active problems to display for this patient.   Past Medical History:  Diagnosis Date  . Hypertension   . Kidney disease 2011    Past Surgical History:  Procedure Laterality Date  . ABDOMINAL HYSTERECTOMY  2007  . CESAREAN SECTION      MEDS:  Reviewed in EPIC and UTD  ALLERGIES: Amlodipine; Doxazosin; and Lisinopril  Family History  Problem Relation Age of Onset  . Hypertension Mother   . Hypertension Father     SH:  Married, non smoker  Review of Systems  Genitourinary:       Discharge  Itching   All other systems reviewed and are negative.   PHYSICAL EXAMINATION:    BP 110/70 (BP Location: Right Arm, Patient Position: Sitting, Cuff Size: Normal)   Pulse 92   Temp 98.7 F (37.1 C) (Oral)   Resp 14   Ht 5' 2.25" (1.581 m)   Wt 189 lb (85.7 kg)   LMP 11/27/2005   BMI 34.29 kg/m     General appearance: alert, cooperative and appears stated age Abdomen: soft, non-tender; bowel sounds normal; no masses,  no organomegaly  Pelvic: External genitalia:  no lesions              Urethra:  normal appearing urethra with no masses, tenderness or lesions              Bartholins and Skenes: normal                 Vagina: normal appearing vagina with white adherent discharge              Cervix: absent              Bimanual  Exam:  Uterus:  uterus absent              Adnexa: no mass, fullness, tenderness              Anus:  nno lesions  Chaperone was present for exam.  Assessment: White vaginal discharge consistent with yeast on exam  Plan: Affirm pending Diflucan 150mg  po x 1, repeat 72 hours.

## 2016-12-17 LAB — WET PREP BY MOLECULAR PROBE
Candida species: NOT DETECTED
GARDNERELLA VAGINALIS: DETECTED — AB
Trichomonas vaginosis: NOT DETECTED

## 2016-12-19 ENCOUNTER — Telehealth: Payer: Self-pay | Admitting: *Deleted

## 2016-12-19 MED ORDER — METRONIDAZOLE 500 MG PO TABS: 500.0000 mg | ORAL_TABLET | Freq: Two times a day (BID) | ORAL | 0 refills | Status: DC

## 2016-12-19 NOTE — Telephone Encounter (Signed)
LM for pt to call back. Rx sent to [facility]

## 2016-12-19 NOTE — Telephone Encounter (Signed)
Pt notified.  Verbalized understanding.

## 2016-12-19 NOTE — Telephone Encounter (Signed)
-----   Message from Megan Salon, MD sent at 12/18/2016  9:20 PM EDT ----- Please let Mrs. Ambrocio know there was BV on the testing done in the office on Friday.  She desires to be treated with oral medication.  Ok to call in flagyl 500mg  bid x 7 days.  #14/0RF.  No follow up needed if symptoms fully resolve.  Thanks.

## 2017-05-18 ENCOUNTER — Other Ambulatory Visit: Payer: Self-pay | Admitting: Obstetrics & Gynecology

## 2017-05-18 DIAGNOSIS — Z1231 Encounter for screening mammogram for malignant neoplasm of breast: Secondary | ICD-10-CM

## 2017-06-28 ENCOUNTER — Ambulatory Visit: Payer: BLUE CROSS/BLUE SHIELD | Admitting: Obstetrics and Gynecology

## 2017-07-11 ENCOUNTER — Encounter: Payer: Self-pay | Admitting: Internal Medicine

## 2017-07-11 ENCOUNTER — Other Ambulatory Visit: Payer: Self-pay

## 2017-07-11 ENCOUNTER — Ambulatory Visit: Payer: BLUE CROSS/BLUE SHIELD | Admitting: Obstetrics & Gynecology

## 2017-07-11 ENCOUNTER — Ambulatory Visit
Admission: RE | Admit: 2017-07-11 | Discharge: 2017-07-11 | Disposition: A | Discharge status: Home / Self Care | Payer: BLUE CROSS/BLUE SHIELD | Source: Ambulatory Visit | Attending: Obstetrics & Gynecology | Admitting: Obstetrics & Gynecology

## 2017-07-11 ENCOUNTER — Encounter: Payer: Self-pay | Admitting: Obstetrics & Gynecology

## 2017-07-11 VITALS — BP 126/70 | HR 90 | Resp 18 | Ht 62.5 in | Wt 190.0 lb

## 2017-07-11 DIAGNOSIS — Z1211 Encounter for screening for malignant neoplasm of colon: Secondary | ICD-10-CM | POA: Diagnosis not present

## 2017-07-11 DIAGNOSIS — Z01419 Encounter for gynecological examination (general) (routine) without abnormal findings: Secondary | ICD-10-CM | POA: Diagnosis not present

## 2017-07-11 DIAGNOSIS — Z0000 Encounter for general adult medical examination without abnormal findings: Secondary | ICD-10-CM

## 2017-07-11 DIAGNOSIS — N951 Menopausal and female climacteric states: Secondary | ICD-10-CM

## 2017-07-11 DIAGNOSIS — Z1231 Encounter for screening mammogram for malignant neoplasm of breast: Secondary | ICD-10-CM

## 2017-07-11 NOTE — Progress Notes (Signed)
51 y.o. [id number] MarriedAfrican AmericanF here for annual exam.  Denies vaginal bleeding.  Wants to have blood work today.  Having more vaginal dryness.  Feeling a little more moody than before.  Manageable but wonders where she is relation to menopause.  Hysterectomy done in 2007.  PCP:  Doesn't have one.    Patient's last menstrual period was 11/27/2005.          Sexually active: Yes.    The current method of family planning is status post hysterectomy.    Exercising: No.  The patient does not participate in regular exercise at present. Smoker:  no  Health Maintenance: Pap:  2007 Neg  History of abnormal Pap:  no MMG:  06/27/16 BIRADS1:neg. Has appt today Colonoscopy:  Never BMD:   Never TDaP:  2016  Screening Labs: Here   reports that  has never smoked. she has never used smokeless tobacco. She reports that she does not drink alcohol or use drugs.  Past Medical History:  Diagnosis Date  . Hypertension   . Kidney disease 2011    Past Surgical History:  Procedure Laterality Date  . ABDOMINAL HYSTERECTOMY  2007  . CESAREAN SECTION      Current Outpatient Medications  Medication Sig Dispense Refill  . cholecalciferol (VITAMIN D) 1000 units tablet Take 1,000 Units by mouth daily.    . fexofenadine (ALLEGRA ODT) 30 MG disintegrating tablet Take 30 mg daily by mouth.    . losartan-hydrochlorothiazide (HYZAAR) 100-25 MG per tablet TAKE ONE TABLET BY MOUTH ONE TIME DAILY    . spironolactone (ALDACTONE) 25 MG tablet Take 1 tablet by mouth daily.     No current facility-administered medications for this visit.     Family History  Problem Relation Age of Onset  . Hypertension Mother   . Hypertension Father     ROS:  Pertinent items are noted in HPI.  Otherwise, a comprehensive ROS was negative.  Exam:   BP 126/70 (BP Location: Right Arm, Patient Position: Sitting, Cuff Size: Normal)   Pulse 90   Resp 18   Ht 5' 2.5" (1.588 m)   Wt 190 lb (86.2 kg)   LMP 11/27/2005    BMI 34.20 kg/m     Height: 5' 2.5" (158.8 cm)  Ht Readings from Last 3 Encounters:  07/11/17 5' 2.5" (1.588 m)  12/16/16 5' 2.25" (1.581 m)  06/27/16 5' 2.25" (1.581 m)    General appearance: alert, cooperative and appears stated age Head: Normocephalic, without obvious abnormality, atraumatic Neck: no adenopathy, supple, symmetrical, trachea midline and thyroid normal to inspection and palpation Lungs: clear to auscultation bilaterally Breasts: normal appearance, no masses or tenderness Heart: regular rate and rhythm Abdomen: soft, non-tender; bowel sounds normal; no masses,  no organomegaly Extremities: extremities normal, atraumatic, no cyanosis or edema Skin: Skin color, texture, turgor normal. No rashes or lesions Lymph nodes: Cervical, supraclavicular, and axillary nodes normal. No abnormal inguinal nodes palpated Neurologic: Grossly normal   Pelvic: External genitalia:  no lesions              Urethra:  normal appearing urethra with no masses, tenderness or lesions              Bartholins and Skenes: normal                 Vagina: normal appearing vagina with normal color and discharge, no lesions              Cervix: surgically absent  Pap taken: No. Bimanual Exam:  Uterus:  uterus absent              Adnexa: no mass, fullness, tenderness               Rectovaginal: Confirms               Anus:  normal sphincter tone, no lesions  Chaperone was present for exam.  A:  Well Woman with normal exam Menopausal symptoms H/O TAH 2007 Hypertension Chronic kidney disease, stage III  P:   Mammogram guidelines reviewed.  Has this scheduled today pap smear not indicated CMP, CBC, Lipids, TSH, Vit obtained today FSh and estradiol obtained Referral to GI for colonoscopy made today Encouraged pt to have PCP considering hx of kidney disease and hypertension.  Lives in [place] but wants all medical care in [place] Return annually or prn

## 2017-07-12 ENCOUNTER — Telehealth: Payer: Self-pay

## 2017-07-12 ENCOUNTER — Telehealth: Payer: Self-pay | Admitting: Obstetrics & Gynecology

## 2017-07-12 DIAGNOSIS — N289 Disorder of kidney and ureter, unspecified: Secondary | ICD-10-CM

## 2017-07-12 DIAGNOSIS — R7989 Other specified abnormal findings of blood chemistry: Secondary | ICD-10-CM

## 2017-07-12 LAB — COMPREHENSIVE METABOLIC PANEL
ALK PHOS: 82 IU/L (ref 39–117)
ALT: 23 IU/L (ref 0–32)
AST: 14 IU/L (ref 0–40)
Albumin/Globulin Ratio: 1.3 (ref 1.2–2.2)
Albumin: 4.2 g/dL (ref 3.5–5.5)
BUN/Creatinine Ratio: 11 (ref 9–23)
BUN: 14 mg/dL (ref 6–24)
CHLORIDE: 98 mmol/L (ref 96–106)
CO2: 27 mmol/L (ref 20–29)
Calcium: 10.2 mg/dL (ref 8.7–10.2)
Creatinine, Ser: 1.26 mg/dL — ABNORMAL HIGH (ref 0.57–1.00)
GFR calc non Af Amer: 50 mL/min/{1.73_m2} — ABNORMAL LOW (ref >59)
GFR, EST AFRICAN AMERICAN: 57 mL/min/{1.73_m2} — AB (ref >59)
GLUCOSE: 100 mg/dL — AB (ref 65–99)
Globulin, Total: 3.3 g/dL (ref 1.5–4.5)
POTASSIUM: 3.8 mmol/L (ref 3.5–5.2)
Sodium: 139 mmol/L (ref 134–144)
TOTAL PROTEIN: 7.5 g/dL (ref 6.0–8.5)

## 2017-07-12 LAB — LIPID PANEL
CHOLESTEROL TOTAL: 208 mg/dL — AB (ref 100–199)
Chol/HDL Ratio: 5.9 ratio — ABNORMAL HIGH (ref 0.0–4.4)
HDL: 35 mg/dL — AB (ref >39)
LDL Calculated: 142 mg/dL — ABNORMAL HIGH (ref 0–99)
TRIGLYCERIDES: 155 mg/dL — AB (ref 0–149)
VLDL CHOLESTEROL CAL: 31 mg/dL (ref 5–40)

## 2017-07-12 LAB — ESTRADIOL: Estradiol: 13.6 pg/mL

## 2017-07-12 LAB — CBC
Hematocrit: 40.5 % (ref 34.0–46.6)
Hemoglobin: 12.9 g/dL (ref 11.1–15.9)
MCH: 24.2 pg — ABNORMAL LOW (ref 26.6–33.0)
MCHC: 31.9 g/dL (ref 31.5–35.7)
MCV: 76 fL — ABNORMAL LOW (ref 79–97)
PLATELETS: 428 10*3/uL — AB (ref 150–379)
RBC: 5.34 x10E6/uL — AB (ref 3.77–5.28)
RDW: 16 % — AB (ref 12.3–15.4)
WBC: 10.4 10*3/uL (ref 3.4–10.8)

## 2017-07-12 LAB — TSH: TSH: 3.19 u[IU]/mL (ref 0.450–4.500)

## 2017-07-12 LAB — VITAMIN D 25 HYDROXY (VIT D DEFICIENCY, FRACTURES): VIT D 25 HYDROXY: 32.5 ng/mL (ref 30.0–100.0)

## 2017-07-12 LAB — FOLLICLE STIMULATING HORMONE: FSH: 62 m[IU]/mL

## 2017-07-12 NOTE — Telephone Encounter (Signed)
Patient would like to speak to a nurse regarding bloodwork

## 2017-07-12 NOTE — Telephone Encounter (Signed)
-----   Message from Megan Salon, MD sent at 07/12/2017  7:16 AM EST ----- Please let pt know that her cbc was fine, thyroid and Vit D were normal.  FSH and estradiol show she is in menopause now.  Metabolic panel showed lowered kidney function--renal insufficiency.  Also, her LDLs are 142 and her HDLs are low.  I think she may need treatment for this as well.  I need her to see a PCP as her kidney function is concerning to me.   She may need a kidney specialist at some point as well.  Does she want to see a PCP here in [place].  She lives in [place].  Thanks.

## 2017-07-12 NOTE — Telephone Encounter (Signed)
Spoke with patient. Results given as seen below. Patient verbalizes understanding. Would like PCP in [place]. Referral placed to Dr.Wallce. Advised she will be contacted to schedule appointment date and time. Patient verbalizes understanding.  Routing to provider for final review. Patient agreeable to disposition. Will close encounter.

## 2017-07-13 NOTE — Telephone Encounter (Signed)
Left message to call [place] at [phone].  Notes recorded by Krist Rosenboom, Harley Hallmark, RN on 07/12/2017 at 11:06 AM EST Spoke with patient. Results given as seen below. Patient verbalizes understanding. Would like PCP in [place]. Referral placed to Dr.Wallce. Advised she will be contacted to schedule appointment date and time. Patient verbalizes understanding. ------  Notes recorded by Megan Salon, MD on 07/12/2017 at 7:16 AM EST Please let pt know that her cbc was fine, thyroid and Vit D were normal. FSH and estradiol show she is in menopause now. Metabolic panel showed lowered kidney function--renal insufficiency. Also, her LDLs are 142 and her HDLs are low. I think she may need treatment for this as well. I need her to see a PCP as her kidney function is concerning to me.  She may need a kidney specialist at some point as well. Does she want to see a PCP here in [place]. She lives in [place]. Thanks.

## 2017-07-27 NOTE — Telephone Encounter (Signed)
Is it ok to close encounter?

## 2017-07-27 NOTE — Telephone Encounter (Signed)
Patient has been notified of lab work as seen below. Attempted to reach patient regarding additional questions with no return call. Patient is scheduled to see Dr.Wallace on 08/04/2017. Encounter closed.

## 2017-08-04 ENCOUNTER — Encounter: Payer: Self-pay | Admitting: Family Medicine

## 2017-08-04 ENCOUNTER — Ambulatory Visit: Payer: BLUE CROSS/BLUE SHIELD | Admitting: Family Medicine

## 2017-08-04 VITALS — BP 134/80 | HR 100 | Temp 97.9°F | Wt 192.2 lb

## 2017-08-04 DIAGNOSIS — N183 Chronic kidney disease, stage 3 unspecified: Secondary | ICD-10-CM

## 2017-08-04 DIAGNOSIS — E782 Mixed hyperlipidemia: Secondary | ICD-10-CM

## 2017-08-04 DIAGNOSIS — I10 Essential (primary) hypertension: Secondary | ICD-10-CM

## 2017-08-04 DIAGNOSIS — D649 Anemia, unspecified: Secondary | ICD-10-CM | POA: Diagnosis not present

## 2017-08-04 DIAGNOSIS — E669 Obesity, unspecified: Secondary | ICD-10-CM | POA: Diagnosis not present

## 2017-08-04 DIAGNOSIS — R7303 Prediabetes: Secondary | ICD-10-CM | POA: Diagnosis not present

## 2017-08-04 LAB — CBC WITH DIFFERENTIAL/PLATELET
Basophils Absolute: 0 10*3/uL (ref 0.0–0.1)
Basophils Relative: 0.3 % (ref 0.0–3.0)
Eosinophils Absolute: 0.1 10*3/uL (ref 0.0–0.7)
Eosinophils Relative: 0.6 % (ref 0.0–5.0)
HCT: 40.5 % (ref 36.0–46.0)
Hemoglobin: 12.8 g/dL (ref 12.0–15.0)
Lymphocytes Relative: 19.9 % (ref 12.0–46.0)
Lymphs Abs: 2.1 10*3/uL (ref 0.7–4.0)
MCHC: 31.6 g/dL (ref 30.0–36.0)
MCV: 75.9 fl — ABNORMAL LOW (ref 78.0–100.0)
Monocytes Absolute: 0.6 10*3/uL (ref 0.1–1.0)
Monocytes Relative: 5.8 % (ref 3.0–12.0)
Neutro Abs: 7.8 10*3/uL — ABNORMAL HIGH (ref 1.4–7.7)
Neutrophils Relative %: 73.4 % (ref 43.0–77.0)
Platelets: 402 10*3/uL — ABNORMAL HIGH (ref 150.0–400.0)
RBC: 5.34 Mil/uL — ABNORMAL HIGH (ref 3.87–5.11)
RDW: 15.2 % (ref 11.5–15.5)
WBC: 10.6 10*3/uL — ABNORMAL HIGH (ref 4.0–10.5)

## 2017-08-04 LAB — COMPREHENSIVE METABOLIC PANEL
ALT: 17 U/L (ref 0–35)
AST: 14 U/L (ref 0–37)
Albumin: 4.2 g/dL (ref 3.5–5.2)
Alkaline Phosphatase: 72 U/L (ref 39–117)
BUN: 18 mg/dL (ref 6–23)
CO2: 31 mEq/L (ref 19–32)
Calcium: 9.6 mg/dL (ref 8.4–10.5)
Chloride: 100 mEq/L (ref 96–112)
Creatinine, Ser: 1.22 mg/dL — ABNORMAL HIGH (ref 0.40–1.20)
GFR: 59.84 mL/min — ABNORMAL LOW (ref >60.00)
Glucose, Bld: 106 mg/dL — ABNORMAL HIGH (ref 70–99)
Potassium: 3.9 mEq/L (ref 3.5–5.1)
Sodium: 138 mEq/L (ref 135–145)
Total Bilirubin: 0.3 mg/dL (ref 0.2–1.2)
Total Protein: 7.7 g/dL (ref 6.0–8.3)

## 2017-08-04 LAB — IRON,TIBC AND FERRITIN PANEL
%SAT: 15 % (calc) (ref 11–50)
Ferritin: 102 ng/mL (ref 10–232)
Iron: 45 ug/dL (ref 45–160)
TIBC: 303 mcg/dL (calc) (ref 250–450)

## 2017-08-04 LAB — POCT GLYCOSYLATED HEMOGLOBIN (HGB A1C): Hemoglobin A1C: 6.1

## 2017-08-04 NOTE — Patient Instructions (Signed)
MANAGEMENT OF CHRONIC CONSTIPATION   Push fluids, all day, everyday  Eat lots of high fiber foods-fruits, veggies, bran and whole grain instead of white bread  Be active everyday. Inactivity makes constipation worse.  Add psyllium daily (Metamucil) which comes in capsules now. Start very low dose and work up to recommended dose on bottle daily.  Stay away from [place] or any magnesium containing laxative, unless you need it to clear things out rarely. It is an addictive laxative and your gut will become dependent on it.  If that is not working, I would start Miralax, which you can buy in generic 17 gms daily. It's a powder and not an "addictive laxative". Take it every day and titrate the dose up or down to get the daily BM.    It was so nice to meet you today!  We are rechecking a few of your labs today.  Follow up in 6 weeks.

## 2017-08-04 NOTE — Progress Notes (Signed)
Carol [name] is a 51 y.o. female is here to [place].   Patient Care Team: Briscoe Deutscher, DO as PCP - General (Family Medicine)   History of Present Illness:   HPI: See Assessment and Plan section for Problem Based Charting of issues discussed today.  Health Maintenance Due  Topic Date Due  . HIV Screening  12/06/1981  . PAP SMEAR  12/07/1987  . COLONOSCOPY  12/06/2016   Depression screen PHQ 2/9 08/04/2017  Decreased Interest 0  Down, Depressed, Hopeless 0  PHQ - 2 Score 0   PMHx, SurgHx, SocialHx, Medications, and Allergies were reviewed in the Visit Navigator and updated as appropriate.   Past Medical History:  Diagnosis Date  . Chicken pox   . GERD (gastroesophageal reflux disease)   . Hay fever   . High cholesterol   . Hypertension   . Kidney disease 2011    Past Surgical History:  Procedure Laterality Date  . ABDOMINAL HYSTERECTOMY  2007  . CESAREAN SECTION      Family History  Problem Relation Age of Onset  . Hypertension Mother   . High Cholesterol Mother   . Kidney disease Mother   . Hypertension Father   . Hypertension Sister   . High Cholesterol Brother   . Kidney disease Brother   . Hypertension Maternal Grandmother   . Heart attack Maternal Grandfather   . Depression Paternal Grandfather   . Kidney disease Paternal Grandfather    Social History   Tobacco Use  . Smoking status: Never Smoker  . Smokeless tobacco: Never Used  Substance Use Topics  . Alcohol use: No    Alcohol/week: 0.0 oz  . Drug use: No    Current Medications and Allergies:   .  cholecalciferol (VITAMIN D) 1000 units tablet, Take 1,000 Units by mouth daily., Disp: , Rfl:  .  fexofenadine (ALLEGRA ODT) 30 MG disintegrating tablet, Take 30 mg daily by mouth., Disp: , Rfl:  .  losartan-hydrochlorothiazide (HYZAAR) 100-25 MG per tablet, TAKE ONE TABLET BY MOUTH ONE TIME DAILY, Disp: , Rfl:  .  spironolactone (ALDACTONE) 25 MG tablet, Take 1 tablet by mouth daily.,  Disp: , Rfl:    Allergies  Allergen Reactions  . Amlodipine     Other reaction(s): OTHER  . Doxazosin     Other reaction(s): OTHER  . Lisinopril Other (See Comments)    unknown   Review of Systems:   Pertinent items are noted in the HPI. Otherwise, ROS is negative.  Vitals:   Vitals:   08/04/17 1105  BP: 134/80  Pulse: 100  Temp: 97.9 F (36.6 C)  TempSrc: Oral  SpO2: 98%  Weight: 192 lb 3.2 oz (87.2 kg)     Body mass index is 34.59 kg/m.   Physical Exam:   Physical Exam  Constitutional: She is oriented to person, place, and time. She appears well-developed and well-nourished. No distress.  HENT:  Head: Normocephalic and atraumatic.  Right Ear: External ear normal.  Left Ear: External ear normal.  Nose: Nose normal.  Mouth/Throat: Oropharynx is clear and moist.  Eyes: Conjunctivae and EOM are normal. Pupils are equal, round, and reactive to light.  Neck: Normal range of motion. Neck supple. No thyromegaly present.  Cardiovascular: Normal rate, regular rhythm, normal heart sounds and intact distal pulses.  Pulmonary/Chest: Effort normal and breath sounds normal.  Abdominal: Soft. Bowel sounds are normal.  Musculoskeletal: Normal range of motion.  Lymphadenopathy:    She has no cervical adenopathy.  Neurological: She is alert and oriented to person, place, and time.  Skin: Skin is warm and dry. Capillary refill takes less than 2 seconds.  Psychiatric: She has a normal mood and affect. Her behavior is normal.  Nursing note and vitals reviewed.   Results for orders placed or performed in visit on 08/04/17  Comprehensive metabolic panel  Result Value Ref Range   Sodium 138 135 - 145 mEq/L   Potassium 3.9 3.5 - 5.1 mEq/L   Chloride 100 96 - 112 mEq/L   CO2 31 19 - 32 mEq/L   Glucose, Bld 106 (H) 70 - 99 mg/dL   BUN 18 6 - 23 mg/dL   Creatinine, Ser 1.22 (H) 0.40 - 1.20 mg/dL   Total Bilirubin 0.3 0.2 - 1.2 mg/dL   Alkaline Phosphatase 72 39 - 117 U/L   AST  14 0 - 37 U/L   ALT 17 0 - 35 U/L   Total Protein 7.7 6.0 - 8.3 g/dL   Albumin 4.2 3.5 - 5.2 g/dL   Calcium 9.6 8.4 - 10.5 mg/dL   GFR 59.84 (L) >60.00 mL/min  CBC with Differential/Platelet  Result Value Ref Range   WBC 10.6 (H) 4.0 - 10.5 K/uL   RBC 5.34 (H) 3.87 - 5.11 Mil/uL   Hemoglobin 12.8 12.0 - 15.0 g/dL   HCT 40.5 36.0 - 46.0 %   MCV 75.9 (L) 78.0 - 100.0 fl   MCHC 31.6 30.0 - 36.0 g/dL   RDW 15.2 11.5 - 15.5 %   Platelets 402.0 (H) 150.0 - 400.0 K/uL   Neutrophils Relative % 73.4 43.0 - 77.0 %   Lymphocytes Relative 19.9 12.0 - 46.0 %   Monocytes Relative 5.8 3.0 - 12.0 %   Eosinophils Relative 0.6 0.0 - 5.0 %   Basophils Relative 0.3 0.0 - 3.0 %   Neutro Abs 7.8 (H) 1.4 - 7.7 K/uL   Lymphs Abs 2.1 0.7 - 4.0 K/uL   Monocytes Absolute 0.6 0.1 - 1.0 K/uL   Eosinophils Absolute 0.1 0.0 - 0.7 K/uL   Basophils Absolute 0.0 0.0 - 0.1 K/uL  Iron, TIBC and Ferritin Panel  Result Value Ref Range   Iron 45 45 - 160 mcg/dL   TIBC 303 250 - 450 mcg/dL (calc)   %SAT 15 11 - 50 % (calc)   Ferritin 102 10 - 232 ng/mL  POCT glycosylated hemoglobin (Hb A1C)  Result Value Ref Range   Hemoglobin A1C 6.1    Assessment and Plan:   Carol [name] was seen today for establish care and ear fullness.  Diagnoses and all orders for this visit:  Hypertension, benign Comments: Avoiding excessive salt intake? [x]   YES  []   NO Trying to exercise on a regular basis? []   YES  [x]   NO Review: taking medications as instructed, no medication side effects noted, no TIAs, no chest pain on exertion, no dyspnea on exertion, no swelling of ankles.   Wt Readings from Last 3 Encounters:  08/04/17 192 lb 3.2 oz (87.2 kg)  07/11/17 190 lb (86.2 kg)  12/16/16 189 lb (85.7 kg)   Reports that  has never smoked.   BP Readings from Last 3 Encounters:  08/04/17 134/80  07/11/17 126/70  12/16/16 110/70   Lab Results  Component Value Date   CREATININE 1.22 (H) 08/04/2017   Orders: -     Comprehensive  metabolic panel  Prediabetes Comments:  See Obesity Plan below. Saxenda will decrease A1c. [place] available to patients - providing  counselor, dietician, and [facility] weight loss app.   Lab Results  Component Value Date   HGBA1C 6.1 08/04/2017   Orders: -     POCT glycosylated hemoglobin (Hb A1C)  Chronic kidney disease Comments:  Stable. Secondary to previously uncontrolled HTN.   Lab Results  Component Value Date   CREATININE 1.22 (H) 08/04/2017   CREATININE 1.26 (H) 07/11/2017   CREATININE 1.15 (H) 06/27/2016   Anemia, improved Comments:  History of anemia. Now postmenopausal. MCV low but iron studies WNL. Hgb now normalized.   CBC Latest Ref Rng & Units 08/04/2017 07/11/2017 06/27/2016  WBC 4.0 - 10.5 K/uL 10.6(H) 10.4 10.8  Hemoglobin 12.0 - 15.0 g/dL 12.8 12.9 11.5(L)  Hematocrit 36.0 - 46.0 % 40.5 40.5 35.4  Platelets 150.0 - 400.0 K/uL 402.0(H) 428(H) 394   Orders: -     CBC with Differential/Platelet -     Iron, TIBC and Ferritin Panel  Mixed hyperlipidemia Comments: Cardiovascular ROS: negative for - chest pain, dyspnea on exertion, edema or irregular heartbeat. Patient nervous about multiple diagnoses today. Will focus on weight loss and blood sugar management first. Recheck FLP in 3-6 months before discussing statin.   Lipids:    Component Value Date/Time   CHOL 208 (H) 07/11/2017 1618   TRIG 155 (H) 07/11/2017 1618   HDL 35 (L) 07/11/2017 1618   VLDL 30 06/27/2016 1540   CHOLHDL 5.9 (H) 07/11/2017 1618   CHOLHDL 6.3 (H) 06/27/2016 1540   Obesity (BMI 30-39.9) Comments: Signs of hypothyroidism: none. Signs of hypercortisolism: none. Contraindications to weight loss: none. Patient readiness to commit to diet and activity changes: excellent. Barriers to weight loss: none.  Plan: 1. Diagnostic studies to rule out secondary causes of obesity: see below. 2. General patient education:   Average sustained weight loss in long-term studies w/lifestyle  interventions alone is 10-15 lb.  Importance of long-term maintenance tx in weight loss.  Use non-food self-rewards to reinforce behavior changes.  Elicit support from others; identify saboteurs.  Practical target weight is usually around 2 BMI units below current weight. 3. Diet interventions:   Risks of dieting were reviewed, including fatigue, temporary hair loss, gallstone formation, gout, and with very low calorie diets, electrolyte abnormalities, nutrient inadequacies, and loss of lean body mass. 4. Exercise intervention:   Informal measures, e.g. taking stairs instead of elevator.  Formal exercise regimen options. 5. Other behavioral treatment: stress management. 6. Other treatment: Medication: SAXENDA, SEE AVS. 7. Patient to keep a weight log that we will review at follow up. 8. Follow up: 3 months and as needed.  Records requested if needed. Time spent with the patient: 45 minutes, of which >50% was spent in obtaining information about her symptoms, reviewing her previous labs, evaluations, and treatments, counseling her about her condition (please see the discussed topics above), and developing a plan to further investigate it; she had a number of questions which I addressed.   . Reviewed expectations re: course of current medical issues. . Discussed self-management of symptoms. . Outlined signs and symptoms indicating need for more acute intervention. . Patient verbalized understanding and all questions were answered. Marland Kitchen Health Maintenance issues including appropriate healthy diet, exercise, and smoking avoidance were discussed with patient. . See orders for this visit as documented in the electronic medical record. . Patient received an After Visit Summary.  Briscoe Deutscher, DO [place] 08/06/2017  Future Appointments  Date Time Provider [place]  09/04/2017  3:30 PM [facility] PRE[facility]  [facility]  09/15/2017  7:20 AM Briscoe Deutscher, DO  [facility]  09/18/2017  9:30 AM Gatha Mayer, MD [facility]  07/16/2018  1:00 PM Megan Salon, MD [place] None

## 2017-08-06 ENCOUNTER — Encounter: Payer: Self-pay | Admitting: Family Medicine

## 2017-08-09 ENCOUNTER — Other Ambulatory Visit: Payer: Self-pay

## 2017-08-09 MED ORDER — LIRAGLUTIDE -WEIGHT MANAGEMENT 18 MG/3ML ~~LOC~~ SOPN: 3.0000 mL | PEN_INJECTOR | Freq: Every day | SUBCUTANEOUS | 1 refills | Status: DC

## 2017-08-09 NOTE — Addendum Note (Signed)
Addended by: Francella Solian on: 08/09/2017 01:25 PM   Modules accepted: Orders

## 2017-08-10 ENCOUNTER — Telehealth: Payer: Self-pay | Admitting: Family Medicine

## 2017-08-10 NOTE — Telephone Encounter (Signed)
Please advise 

## 2017-08-10 NOTE — Telephone Encounter (Unsigned)
Copied from [place]. Topic: Quick Communication - See Telephone Encounter >> Aug 10, 2017  2:01 PM Hewitt Shorts wrote: CRM for notification. See Telephone encounter for: pt is needing Dr. Juleen China to change the medication saxenda to something else it is toooo expensive they pharmacy states it is over [place] number [phone]   08/10/17.

## 2017-08-17 NOTE — Telephone Encounter (Signed)
Please see if Carol [name] covered with all of her comorbid conditions.

## 2017-08-24 NOTE — Telephone Encounter (Signed)
auth submitted on Cover my meds.  Key: [id number] Per cover my meds should have answer within 24hrs.

## 2017-08-28 NOTE — Telephone Encounter (Signed)
Per cover my meds Your PA request has been denied. Additional information will be provided in the denial communication. (Message 1140) Your PA request has been denied. Additional information will be provided in the denial communication. (Message 1140)

## 2017-09-02 NOTE — Telephone Encounter (Signed)
I have had other patients covered. I know that insurance is fickle. Please let the patient know. We should speak about future options.

## 2017-09-04 ENCOUNTER — Other Ambulatory Visit: Payer: Self-pay

## 2017-09-04 ENCOUNTER — Ambulatory Visit (AMBULATORY_SURGERY_CENTER): Payer: Self-pay | Admitting: *Deleted

## 2017-09-04 VITALS — Ht 62.5 in | Wt 195.8 lb

## 2017-09-04 DIAGNOSIS — Z1211 Encounter for screening for malignant neoplasm of colon: Secondary | ICD-10-CM

## 2017-09-04 NOTE — Progress Notes (Signed)
No egg or soy allergy known to patient  No issues with past sedation with any surgeries  or procedures, no intubation problems  No diet pills per patient No home 02 use per patient  No blood thinners per patient  Pt denies issues with constipation on and off Takes metamucil or miralax if needed.   No A fib or A flutter  EMMI video sent to pt's e mail pt. declined

## 2017-09-04 NOTE — Telephone Encounter (Signed)
Patient has f/u on 09/15/17 she will talk to provider then.

## 2017-09-15 ENCOUNTER — Encounter: Payer: Self-pay | Admitting: Physical Therapy

## 2017-09-15 ENCOUNTER — Ambulatory Visit: Payer: BLUE CROSS/BLUE SHIELD | Admitting: Family Medicine

## 2017-09-15 ENCOUNTER — Encounter: Payer: Self-pay | Admitting: Family Medicine

## 2017-09-15 VITALS — BP 128/86 | HR 115 | Temp 98.6°F | Ht 62.5 in | Wt 189.8 lb

## 2017-09-15 DIAGNOSIS — N183 Chronic kidney disease, stage 3 unspecified: Secondary | ICD-10-CM

## 2017-09-15 DIAGNOSIS — R7303 Prediabetes: Secondary | ICD-10-CM | POA: Diagnosis not present

## 2017-09-15 DIAGNOSIS — I10 Essential (primary) hypertension: Secondary | ICD-10-CM | POA: Diagnosis not present

## 2017-09-15 DIAGNOSIS — N951 Menopausal and female climacteric states: Secondary | ICD-10-CM | POA: Diagnosis not present

## 2017-09-15 MED ORDER — ESTRADIOL 0.1 MG/GM VA CREA: TOPICAL_CREAM | VAGINAL | 0 refills | Status: DC

## 2017-09-15 MED ORDER — DULAGLUTIDE 1.5 MG/0.5ML ~~LOC~~ SOAJ: 1.5000 mg | SUBCUTANEOUS | 1 refills | Status: DC

## 2017-09-15 MED ORDER — DULAGLUTIDE 0.75 MG/0.5ML ~~LOC~~ SOAJ: SUBCUTANEOUS | 0 refills | Status: DC

## 2017-09-15 MED ORDER — DULAGLUTIDE 1.5 MG/0.5ML ~~LOC~~ SOAJ: 1.5000 mg | SUBCUTANEOUS | 0 refills | Status: DC

## 2017-09-15 MED ORDER — LOSARTAN POTASSIUM 100 MG PO TABS: 100.0000 mg | ORAL_TABLET | Freq: Every day | ORAL | 3 refills | Status: DC

## 2017-09-15 MED ORDER — CHLORTHALIDONE 25 MG PO TABS: 25.0000 mg | ORAL_TABLET | Freq: Every day | ORAL | 3 refills | Status: DC

## 2017-09-15 NOTE — Progress Notes (Signed)
Carol [name] is a 52 y.o. female is here for follow up.  History of Present Illness:   Shaune Pascal CMA acting as scribe for Dr. Juleen China.  HPI: Patient comes in for follow up. She was going to start Saxenda. This was denied.   Health Maintenance Due  Topic Date Due  . HIV Screening  12/06/1981  . PAP SMEAR  12/07/1987  . COLONOSCOPY  12/06/2016   Depression screen PHQ 2/9 08/04/2017  Decreased Interest 0  Down, Depressed, Hopeless 0  PHQ - 2 Score 0   PMHx, SurgHx, SocialHx, FamHx, Medications, and Allergies were reviewed in the Visit Navigator and updated as appropriate.   Patient Active Problem List   Diagnosis Date Noted  . Reflux 12/07/2012  . Chronic kidney disease, stage III (moderate) ([place]) 12/03/2010  . Hypertension, benign 12/03/2010   Social History   Tobacco Use  . Smoking status: Never Smoker  . Smokeless tobacco: Never Used  Substance Use Topics  . Alcohol use: Yes    Alcohol/week: 0.0 oz    Comment: occasionally  . Drug use: No   Current Medications and Allergies:   .  cholecalciferol (VITAMIN D) 1000 units tablet, Take 1,000 Units by mouth daily., Disp: , Rfl:  .  fexofenadine (ALLEGRA ODT) 30 MG disintegrating tablet, Take 30 mg daily by mouth., Disp: , Rfl:  .  losartan-hydrochlorothiazide (HYZAAR) 100-25 MG per tablet, TAKE ONE TABLET BY MOUTH ONE TIME DAILY, Disp: , Rfl:  .  polyethylene glycol (MIRALAX / GLYCOLAX) packet, Take 17 g by mouth as needed., Disp: , Rfl:  .  psyllium (REGULOID) 0.52 g capsule, Take 0.52 g by mouth daily., Disp: , Rfl:  .  ranitidine (ZANTAC) 150 MG tablet, Take 150 mg by mouth 2 (two) times daily., Disp: , Rfl:  .  spironolactone (ALDACTONE) 25 MG tablet, Take 1 tablet by mouth daily., Disp: , Rfl:    Allergies  Allergen Reactions  . Amlodipine     Other reaction(s): OTHER  . Doxazosin     Other reaction(s): OTHER  . Lisinopril Other (See Comments)    unknown   Review of Systems   Pertinent items are noted in  the HPI. Otherwise, ROS is negative.  Vitals:   Vitals:   09/15/17 1328  BP: 128/86  Pulse: (!) 115  Temp: 98.6 F (37 C)  TempSrc: Oral  SpO2: 97%  Weight: 189 lb 12.8 oz (86.1 kg)  Height: 5' 2.5" (1.588 m)     Body mass index is 34.16 kg/m.  Physical Exam:   Physical Exam  Constitutional: She is oriented to person, place, and time. She appears well-developed and well-nourished. No distress.  HENT:  Head: Normocephalic and atraumatic.  Right Ear: External ear normal.  Left Ear: External ear normal.  Nose: Nose normal.  Mouth/Throat: Oropharynx is clear and moist.  Eyes: Conjunctivae and EOM are normal. Pupils are equal, round, and reactive to light.  Neck: Normal range of motion. Neck supple. No thyromegaly present.  Cardiovascular: Normal rate, regular rhythm, normal heart sounds and intact distal pulses.  Pulmonary/Chest: Effort normal and breath sounds normal.  Abdominal: Soft. Bowel sounds are normal.  Musculoskeletal: Normal range of motion.  Lymphadenopathy:    She has no cervical adenopathy.  Neurological: She is alert and oriented to person, place, and time.  Skin: Skin is warm and dry. Capillary refill takes less than 2 seconds.  Psychiatric: She has a normal mood and affect. Her behavior is normal.  Nursing note and  vitals reviewed.   Results for orders placed or performed in visit on 07/86/75  Basic metabolic panel  Result Value Ref Range   Glucose 117    Creatinine 1.2 (A) 0.5 - 1.1   Assessment and Plan:   1. Prediabetes Maintaining a diabetic diet? [x]   YES  []   NO Trying to exercise on a regular basis? [x]   YES  []   NO  On ACE inhibitor or angiotensin II receptor blocker? [x]   YES  []   NO On Aspirin? [x]   YES  []   NO  Lab Results  Component Value Date   HGBA1C 6.1 08/04/2017    No results found for: Derl Barrow  Lab Results  Component Value Date   CHOL 208 (H) 07/11/2017   HDL 35 (L) 07/11/2017   LDLCALC 142 (H) 07/11/2017    TRIG 155 (H) 07/11/2017   CHOLHDL 5.9 (H) 07/11/2017     Wt Readings from Last 3 Encounters:  09/15/17 189 lb 12.8 oz (86.1 kg)  09/04/17 195 lb 12.8 oz (88.8 kg)  08/04/17 192 lb 3.2 oz (87.2 kg)   BP Readings from Last 3 Encounters:  09/15/17 128/86  08/04/17 134/80  07/11/17 126/70   Lab Results  Component Value Date   CREATININE 1.22 (H) 08/04/2017   - Dulaglutide (TRULICITY) 4.49 EE/1.0OF SOPN; 0.75 mg [place] q wk x 2 wks , then increase to 1.5 mg [place] if tolerating  Dispense: 2 pen; Refill: 0 - Dulaglutide (TRULICITY) 1.5 HQ/1.9XJ SOPN; Inject 1.5 mg into the skin once a week.  Dispense: 2 pen; Refill: 0 - Dulaglutide (TRULICITY) 1.5 OI/3.2PQ SOPN; Inject 1.5 mg into the skin once a week.  Dispense: 4 pen; Refill: 1  2. Chronic kidney disease, stage III (moderate) (HCC) Assessment: CKD: stage 3 - GFR 30-59 stable. Plan: glycemic control, blood pressure control, avoid NSAIDs and consult physician early if nausea, vomiting, or diarrhea.   Lab Results  Component Value Date   CREATININE 1.22 (H) 08/04/2017   CREATININE 1.26 (H) 07/11/2017   CREATININE 1.2 (A) 10/04/2016   3. Menopausal vaginal dryness NEW. After discussion, patient would like to start below medication. Expectations, risks, and potential side effects reviewed.   - estradiol (ESTRACE) 0.1 MG/GM vaginal cream; 0.5 g intravaginally 1-3 times per week.  Dispense: 42.5 g; Refill: 0  4. Essential hypertension Avoiding excessive salt intake? [x]   YES  []   NO Trying to exercise on a regular basis? [x]   YES  []   NO Review: taking medications as instructed, no medication side effects noted, no TIAs, no chest pain on exertion, no dyspnea on exertion, no swelling of ankles.  Smoker: No.  Wt Readings from Last 3 Encounters:  09/15/17 189 lb 12.8 oz (86.1 kg)  09/04/17 195 lb 12.8 oz (88.8 kg)  08/04/17 192 lb 3.2 oz (87.2 kg)   BP Readings from Last 3 Encounters:  09/15/17 128/86  08/04/17 134/80  07/11/17 126/70    Lab Results  Component Value Date   CREATININE 1.22 (H) 08/04/2017   - losartan (COZAAR) 100 MG tablet; Take 1 tablet (100 mg total) by mouth daily.  Dispense: 90 tablet; Refill: 3 - chlorthalidone (HYGROTON) 25 MG tablet; Take 1 tablet (25 mg total) by mouth daily.  Dispense: 30 tablet; Refill: 3  5. Morbid obesity ([place]) The patient is asked to make an attempt to improve diet and exercise patterns to aid in medical management of this problem.  - Dulaglutide (TRULICITY) 9.82 ME/1.5AX SOPN; 0.75 mg Sorrel q wk  x 2 wks , then increase to 1.5 mg [place] if tolerating  Dispense: 2 pen; Refill: 0 - Dulaglutide (TRULICITY) 1.5 MB/8.6LJ SOPN; Inject 1.5 mg into the skin once a week.  Dispense: 2 pen; Refill: 0 - Dulaglutide (TRULICITY) 1.5 QG/9.2EF SOPN; Inject 1.5 mg into the skin once a week.  Dispense: 4 pen; Refill: 1   . Reviewed expectations re: course of current medical issues. . Discussed self-management of symptoms. . Outlined signs and symptoms indicating need for more acute intervention. . Patient verbalized understanding and all questions were answered. Marland Kitchen Health Maintenance issues including appropriate healthy diet, exercise, and smoking avoidance were discussed with patient. . See orders for this visit as documented in the electronic medical record. . Patient received an After Visit Summary.  CMA served as Education administrator during this visit. History, Physical, and Plan performed by medical provider. The above documentation has been reviewed and is accurate and complete. Briscoe Deutscher, D.O.  Briscoe Deutscher, DO [place] 09/17/2017

## 2017-09-15 NOTE — Patient Instructions (Signed)
Was great to see you today.  I have given you samples of Trulicity today.  We will see if your insurance will cover it.  If not, I will call in Qsymia.  I have stopped your hydrochlorothiazide and spironolactone.  I have changed your medication to chlorthalidone.  Please keep an eye on your blood pressure.  Let me know if you have any issues.  Vaginal moisturizers and lubricants  Product (manufacturer) Ingredients Notes  Moisturizers    Replens Water, carbomer, polycarbophil, paraffin, hydrogenated palm oil, glyceride, sorbic acid, and sodium hydroxide Should be used 3 times weekly  Me AgainT Water, carbomer, aloe, citric acid, chlorhexidine deglutinate, sodium benzoate, potassium sorbate, diazolidinyl urea, and sorbic acid    Vagisil Feminine Moisturizer Water, glycerin, propylene glycol, poloxamer 407, methylparaben, polyquaternium-32, propylparaben, chamomile, and aloe    Feminease Water, mineral oil, glycerin, yerba santa, cetyl alcohol, and methyl paraben Yerba santa (Eriodictyon spp), a plant native to the [facility], is used as a moisturizer in place of aloe  K[employer]Y [employer], propylene glycol, sorbitol, polysorbate 60, hydroxyethylcellulose, benzoic acid, methylparaben, tocopherol, and aloe         Lubricants    Water-based    Slippery Stuff Water, polyoxyethylene, methylparaben, propylene glycol, isopropynol    Astroglide Water, glycerin, methylparaben, propylparaben, polypropylene glycol, polyquaternium, hydroxyethylcellulose, and sodium benzoate Also sold in a glycerin-free and paraben-free formulation  K-Y Jelly Water, glycerin, hydroxyethylcellulose, parabens, and chlorhexidine    Pre-Seed Water, hydroxyethylcellulose, arabinogalactan, paraben, and Pluronic copolymers Promoted to women and their partners who are trying to conceive  Silicone-based    ID [place]ium Cyclomethicone, dimethicone, and dimethiconol Less drying than other lubricants  Pjur Eros  Cyclopentasiloxane, dimethicone, and dimethiconol Compatible with a condom  PinkT Dimethicone, vitamin E, aloe vera, dimethiconol, and cyclomethicone Compatible with a condom  Oil-based    [place] Women's Lubricant Natural oils Does not contain alcohol, glycerin, or parabens; is not compatible with a condom

## 2017-09-18 ENCOUNTER — Telehealth: Payer: Self-pay

## 2017-09-18 ENCOUNTER — Ambulatory Visit (AMBULATORY_SURGERY_CENTER): Payer: BLUE CROSS/BLUE SHIELD | Admitting: Internal Medicine

## 2017-09-18 ENCOUNTER — Encounter: Payer: Self-pay | Admitting: Internal Medicine

## 2017-09-18 ENCOUNTER — Other Ambulatory Visit: Payer: Self-pay

## 2017-09-18 VITALS — BP 128/81 | HR 93 | Temp 98.6°F | Resp 19

## 2017-09-18 DIAGNOSIS — D122 Benign neoplasm of ascending colon: Secondary | ICD-10-CM

## 2017-09-18 DIAGNOSIS — Z1211 Encounter for screening for malignant neoplasm of colon: Secondary | ICD-10-CM

## 2017-09-18 DIAGNOSIS — Z1212 Encounter for screening for malignant neoplasm of rectum: Secondary | ICD-10-CM

## 2017-09-18 MED ORDER — SODIUM CHLORIDE 0.9 % IV SOLN: 500.0000 mL | Freq: Once | INTRAVENOUS | Status: DC

## 2017-09-18 NOTE — Patient Instructions (Addendum)
I found and removed one tiny polyp. I will let you know pathology results and when to have another routine colonoscopy by mail and/or My Chart.  You also have a condition called diverticulosis - common and not usually a problem. Please read the handout provided.  I appreciate the opportunity to care for you. Gatha Mayer, MD, FACG    YOU HAD AN ENDOSCOPIC PROCEDURE TODAY AT [facility]:   Refer to the procedure report that was given to you for any specific questions about what was found during the examination.  If the procedure report does not answer your questions, please call your gastroenterologist to clarify.  If you requested that your care partner not be given the details of your procedure findings, then the procedure report has been included in a sealed envelope for you to review at your convenience later.  YOU SHOULD EXPECT: Some feelings of bloating in the abdomen. Passage of more gas than usual.  Walking can help get rid of the air that was put into your GI tract during the procedure and reduce the bloating. If you had a lower endoscopy (such as a colonoscopy or flexible sigmoidoscopy) you may notice spotting of blood in your stool or on the toilet paper. If you underwent a bowel prep for your procedure, you may not have a normal bowel movement for a few days.  Please Note:  You might notice some irritation and congestion in your nose or some drainage.  This is from the oxygen used during your procedure.  There is no need for concern and it should clear up in a day or so.  SYMPTOMS TO REPORT IMMEDIATELY:   Following lower endoscopy (colonoscopy or flexible sigmoidoscopy):  Excessive amounts of blood in the stool  Significant tenderness or worsening of abdominal pains  Swelling of the abdomen that is new, acute  Fever of 100F or higher  For urgent or emergent issues, a gastroenterologist can be reached at any hour by calling [phone].   DIET:   We do recommend a small meal at first, but then you may proceed to your regular diet.  Drink plenty of fluids but you should avoid alcoholic beverages for 24 hours.  ACTIVITY:  You should plan to take it easy for the rest of today and you should NOT DRIVE or use heavy machinery until tomorrow (because of the sedation medicines used during the test).    FOLLOW UP: Our staff will call the number listed on your records the next business day following your procedure to check on you and address any questions or concerns that you may have regarding the information given to you following your procedure. If we do not reach you, we will leave a message.  However, if you are feeling well and you are not experiencing any problems, there is no need to return our call.  We will assume that you have returned to your regular daily activities without incident.  If any biopsies were taken you will be contacted by phone or by letter within the next 1-3 weeks.  Please call us at [phone] if you have not heard about the biopsies in 3 weeks.    SIGNATURES/CONFIDENTIALITY: You and/or your care partner have signed paperwork which will be entered into your electronic medical record.  These signatures attest to the fact that that the information above on your After Visit Summary has been reviewed and is understood.  Full responsibility of the confidentiality of this discharge  information lies with you and/or your care-partner.   Handouts were given to your care partner on polyps and diverticulosis. You may resume your current medications today. Await biopsy results. Please call if any questions or concerns.

## 2017-09-18 NOTE — Progress Notes (Signed)
Called to room to assist during endoscopic procedure.  Patient ID and intended procedure confirmed with present staff. Received instructions for my participation in the procedure from the performing physician.  

## 2017-09-18 NOTE — Op Note (Signed)
[facility] Patient Name: Carol [name] Procedure Date: 09/18/2017 9:49 AM MRN: [id number] Endoscopist: Gatha Mayer , MD Age: 52 Referring MD:  Date of Birth: October 15, 1965 Gender: Female Account #: [id number] Procedure:                Colonoscopy Indications:              Screening for colorectal malignant neoplasm, This                            is the patient's first colonoscopy Medicines:                Propofol per Anesthesia, Monitored Anesthesia Care Procedure:                Pre-Anesthesia Assessment:                           - Prior to the procedure, a History and Physical                            was performed, and patient medications and                            allergies were reviewed. The patient's tolerance of                            previous anesthesia was also reviewed. The risks                            and benefits of the procedure and the sedation                            options and risks were discussed with the patient.                            All questions were answered, and informed consent                            was obtained. Prior Anticoagulants: The patient has                            taken no previous anticoagulant or antiplatelet                            agents. ASA Grade Assessment: II - A patient with                            mild systemic disease. After reviewing the risks                            and benefits, the patient was deemed in                            satisfactory condition to undergo the procedure.  After obtaining informed consent, the colonoscope                            was passed under direct vision. Throughout the                            procedure, the patient's blood pressure, pulse, and                            oxygen saturations were monitored continuously. The                            Colonoscope was introduced through the anus and   advanced to the the cecum, identified by                            appendiceal orifice and ileocecal valve. The                            colonoscopy was performed without difficulty. The                            patient tolerated the procedure well. The quality                            of the bowel preparation was excellent. The                            ileocecal valve, appendiceal orifice, and rectum                            were photographed. The bowel preparation used was                            Miralax. Scope In: 9:55:08 AM Scope Out: 10:07:31 AM Scope Withdrawal Time: 0 hours 9 minutes 0 seconds  Total Procedure Duration: 0 hours 12 minutes 23 seconds  Findings:                 The perianal and digital rectal examinations were                            normal.                           A 1 to 2 mm polyp was found in the ascending colon.                            The polyp was sessile. The polyp was removed with a                            cold biopsy forceps. Resection and retrieval were                            complete. Verification of patient identification  for the specimen was done. Estimated blood loss was                            minimal.                           A few small-mouthed diverticula were found in the                            sigmoid colon.                           The exam was otherwise without abnormality on                            direct and retroflexion views. Complications:            No immediate complications. Estimated Blood Loss:     Estimated blood loss was minimal. Impression:               - One 1 to 2 mm polyp in the ascending colon,                            removed with a cold biopsy forceps. Resected and                            retrieved.                           - Diverticulosis in the sigmoid colon.                           - The examination was otherwise normal on direct                             and retroflexion views. Recommendation:           - Patient has a contact number available for                            emergencies. The signs and symptoms of potential                            delayed complications were discussed with the                            patient. Return to normal activities tomorrow.                            Written discharge instructions were provided to the                            patient.                           - Resume previous diet.                           -  Continue present medications.                           - Repeat colonoscopy is recommended. The                            colonoscopy date will be determined after pathology                            results from today's exam become available for                            review. Gatha Mayer, MD 09/18/2017 10:12:14 AM This report has been signed electronically.

## 2017-09-18 NOTE — Progress Notes (Signed)
Report to PACU, RN, vss, BBS= Clear.  

## 2017-09-18 NOTE — Telephone Encounter (Signed)
Prior Authorization started for : Trulicity  Key [id number] Rx #: [id number]  Answer in 72 hours per cover my meds.

## 2017-09-18 NOTE — Progress Notes (Signed)
No problems noted in the recovery room. maw 

## 2017-09-18 NOTE — Progress Notes (Signed)
Pt's states no medical or surgical changes since previsit or office visit. 

## 2017-09-19 ENCOUNTER — Telehealth: Payer: Self-pay | Admitting: *Deleted

## 2017-09-19 NOTE — Telephone Encounter (Signed)
  Follow up Call-  Call back number 09/18/2017  Post procedure Call Back phone  # [phone]  Permission to leave phone message Yes  Some recent data might be hidden     Patient questions:  Do you have a fever, pain , or abdominal swelling? No. Pain Score  0 *  Have you tolerated food without any problems? Yes.    Have you been able to return to your normal activities? Yes.    Do you have any questions about your discharge instructions: Diet   No. Medications  No. Follow up visit  No.  Do you have questions or concerns about your Care? No.  Actions: * If pain score is 4 or above: No action needed, pain <4.

## 2017-09-25 NOTE — Telephone Encounter (Signed)
Fax received that Carol [name] was denied for following reason:  only approved for type 2 diabetes.

## 2017-09-26 ENCOUNTER — Encounter: Payer: Self-pay | Admitting: Internal Medicine

## 2017-09-26 NOTE — Progress Notes (Signed)
Benign mucosal polyp Recall [id number]

## 2017-10-02 ENCOUNTER — Encounter: Payer: Self-pay | Admitting: Family Medicine

## 2017-10-02 DIAGNOSIS — R7303 Prediabetes: Secondary | ICD-10-CM | POA: Insufficient documentation

## 2017-10-02 NOTE — Telephone Encounter (Signed)
Please let the patient know. We will discuss changes at the next visit.

## 2017-10-02 NOTE — Telephone Encounter (Signed)
Called patient gave information. I have made f/u in 3 months to review medication options.

## 2017-10-12 ENCOUNTER — Telehealth: Payer: Self-pay | Admitting: Family Medicine

## 2017-10-12 NOTE — Telephone Encounter (Signed)
Please advise 

## 2017-10-12 NOTE — Telephone Encounter (Signed)
Copied from [place]. Topic: Inquiry >> Oct 12, 2017  3:16 PM Conception Chancy, NT wrote: Carol [name] is calling from [facility] and would like all the labs from the last 3 months faxed to her at [phone]. Patient is coming for a follow up and she does not want to repeat anything she has done recently.   Carol [name] Cb# [phone]

## 2017-10-13 NOTE — Telephone Encounter (Signed)
Caller name: Tess with CoverMyMeds Can be reached: [phone] Ref# [id number]  Reason for call: calling to verify if appeal was received for trulicity and offer help as needed. Appeal was created 10/10/17.

## 2017-10-13 NOTE — Telephone Encounter (Signed)
Please advise 

## 2017-10-13 NOTE — Telephone Encounter (Signed)
Labs faxed to number provided. Via chart

## 2017-11-17 NOTE — Telephone Encounter (Signed)
Approval received auth number [id number] Effective 11-01-17 to 11-02-2018 ppw sent to scan

## 2017-11-17 NOTE — Telephone Encounter (Signed)
Called patient and let her know.  

## 2017-11-20 ENCOUNTER — Telehealth: Payer: Self-pay | Admitting: Family Medicine

## 2017-11-20 DIAGNOSIS — R7303 Prediabetes: Secondary | ICD-10-CM

## 2017-11-20 NOTE — Telephone Encounter (Signed)
Copied from [place]. Topic: Quick Communication - See Telephone Encounter >> Nov 20, 2017  5:17 PM Arletha Grippe wrote: CRM for notification. See Telephone encounter for: 11/20/17.  Pt got a call on Friday that she could go to pharm to pick up trulicity and that she got approved for it.  Cb is [id number]

## 2017-11-21 MED ORDER — DULAGLUTIDE 1.5 MG/0.5ML ~~LOC~~ SOAJ: 1.5000 mg | SUBCUTANEOUS | 1 refills | Status: DC

## 2017-11-21 NOTE — Telephone Encounter (Signed)
See note

## 2017-11-21 NOTE — Telephone Encounter (Signed)
Patient stated that she did not get medication Friday because of the pharmacy did not have. I have sent prescription to the pharmacy.

## 2018-01-03 NOTE — Progress Notes (Signed)
Carol [name] is a 52 y.o. female is here for follow up.  History of Present Illness:   HPI: Patient presents for follow-up.  She continues to take Trulicity as directed.  She does notice some increased dyspepsia on days 1 and 2 after taking injections.  Continued weight loss.  She did see her nephrologist 2 months ago and is doing well.  Follow-up in 1 year.  Blood pressures have remained stable.  She does become tachycardic when she comes into the office due to anxiety.  Complains of acute vaginitis today and states that she has a history of BV and yeast vaginitis.  Health Maintenance Due  Topic Date Due  . PAP SMEAR  12/07/1987   Depression screen PHQ 2/9 08/04/2017  Decreased Interest 0  Down, Depressed, Hopeless 0  PHQ - 2 Score 0   PMHx, SurgHx, SocialHx, FamHx, Medications, and Allergies were reviewed in the Visit Navigator and updated as appropriate.   Patient Active Problem List   Diagnosis Date Noted  . Morbid obesity ([place]) 01/05/2018  . Prediabetes 10/02/2017  . Dyspepsia 12/07/2012  . Chronic kidney disease, stage III (moderate) ([facility]) 12/03/2010  . Hypertension, benign 12/03/2010   Social History   Tobacco Use  . Smoking status: Never Smoker  . Smokeless tobacco: Never Used  Substance Use Topics  . Alcohol use: Yes    Alcohol/week: 0.0 oz    Comment: occasionally  . Drug use: No   Current Medications and Allergies:   Current Outpatient Medications:  .  chlorthalidone (HYGROTON) 25 MG tablet, Take 1 tablet (25 mg total) by mouth daily. (Patient not taking: Reported on 09/18/2017), Disp: 30 tablet, Rfl: 3 .  cholecalciferol (VITAMIN D) 1000 units tablet, Take 1,000 Units by mouth daily., Disp: , Rfl:  .  Dulaglutide (TRULICITY) 4.58 KD/9.8PJ SOPN, 0.75 mg [place] q wk x 2 wks , then increase to 1.5 mg [place] if tolerating (Patient not taking: Reported on 09/18/2017), Disp: 2 pen, Rfl: 0 .  Dulaglutide (TRULICITY) 1.5 AS/5.0NL SOPN, Inject 1.5 mg into the skin once a week.  (Patient not taking: Reported on 09/18/2017), Disp: 2 pen, Rfl: 0 .  Dulaglutide (TRULICITY) 1.5 ZJ/6.7HA SOPN, Inject 1.5 mg into the skin once a week., Disp: 4 pen, Rfl: 1 .  estradiol (ESTRACE) 0.1 MG/GM vaginal cream, 0.5 g intravaginally 1-3 times per week. (Patient not taking: Reported on 09/18/2017), Disp: 42.5 g, Rfl: 0 .  fexofenadine (ALLEGRA ODT) 30 MG disintegrating tablet, Take 30 mg daily by mouth., Disp: , Rfl:  .  losartan (COZAAR) 100 MG tablet, Take 1 tablet (100 mg total) by mouth daily., Disp: 90 tablet, Rfl: 3 .  psyllium (REGULOID) 0.52 g capsule, Take 0.52 g by mouth daily., Disp: , Rfl:  .  ranitidine (ZANTAC) 150 MG tablet, Take 150 mg by mouth 2 (two) times daily., Disp: , Rfl:   Current Facility-Administered Medications:  .  0.9 %  sodium chloride infusion, 500 mL, Intravenous, Once, Gatha Mayer, MD   Allergies  Allergen Reactions  . Amlodipine     Other reaction(s): OTHER  . Doxazosin     Other reaction(s): OTHER  . Lisinopril Other (See Comments)    unknown   Review of Systems   Pertinent items are noted in the HPI. Otherwise, ROS is negative.  Vitals:   Vitals:   01/05/18 1424  BP: 122/74  Pulse: (!) 112  Temp: 98.8 F (37.1 C)  TempSrc: Oral  SpO2: 98%  Weight: 186 lb (84.4  kg)  Height: 5' 2.5" (1.588 m)     Body mass index is 33.48 kg/m.  Physical Exam:   Physical Exam  Constitutional: She is oriented to person, place, and time. She appears well-developed and well-nourished. No distress.  HENT:  Head: Normocephalic and atraumatic.  Right Ear: External ear normal.  Left Ear: External ear normal.  Nose: Nose normal.  Mouth/Throat: Oropharynx is clear and moist.  Eyes: Pupils are equal, round, and reactive to light. Conjunctivae and EOM are normal.  Neck: Normal range of motion. Neck supple. No thyromegaly present.  Cardiovascular: Normal rate, regular rhythm, normal heart sounds and intact distal pulses.  Pulmonary/Chest: Effort  normal and breath sounds normal.  Abdominal: Soft. Bowel sounds are normal.  Musculoskeletal: Normal range of motion.  Lymphadenopathy:    She has no cervical adenopathy.  Neurological: She is alert and oriented to person, place, and time.  Skin: Skin is warm and dry. Capillary refill takes less than 2 seconds.  Psychiatric: She has a normal mood and affect. Her behavior is normal.  Nursing note and vitals reviewed.    Assessment and Plan:   Diagnoses and all orders for this visit:  Chronic kidney disease, stage III (moderate) (HCC)  Prediabetes -     Dulaglutide (TRULICITY) 1.5 HB/7.1IR SOPN; Inject 1.5 mg into the skin once a week.  Morbid obesity (HCC) -     Dulaglutide (TRULICITY) 1.5 CV/8.9FY SOPN; Inject 1.5 mg into the skin once a week.  Hypertension, benign  Dyspepsia  Acute vaginitis -     fluconazole (DIFLUCAN) 150 MG tablet; Take 1 tablet (150 mg total) by mouth once a week. -     metroNIDAZOLE (FLAGYL) 500 MG tablet; Take 1 tablet (500 mg total) by mouth 2 (two) times daily.   . Reviewed expectations re: course of current medical issues. . Discussed self-management of symptoms. . Outlined signs and symptoms indicating need for more acute intervention. . Patient verbalized understanding and all questions were answered. Marland Kitchen Health Maintenance issues including appropriate healthy diet, exercise, and smoking avoidance were discussed with patient. . See orders for this visit as documented in the electronic medical record. . Patient received an After Visit Summary.  Briscoe Deutscher, DO [place], [facility] 01/05/2018

## 2018-01-05 ENCOUNTER — Ambulatory Visit: Payer: BLUE CROSS/BLUE SHIELD | Admitting: Family Medicine

## 2018-01-05 ENCOUNTER — Encounter: Payer: Self-pay | Admitting: Family Medicine

## 2018-01-05 VITALS — BP 122/74 | HR 112 | Temp 98.8°F | Ht 62.5 in | Wt 186.0 lb

## 2018-01-05 DIAGNOSIS — N760 Acute vaginitis: Secondary | ICD-10-CM | POA: Diagnosis not present

## 2018-01-05 DIAGNOSIS — R7303 Prediabetes: Secondary | ICD-10-CM | POA: Diagnosis not present

## 2018-01-05 DIAGNOSIS — N183 Chronic kidney disease, stage 3 unspecified: Secondary | ICD-10-CM

## 2018-01-05 DIAGNOSIS — I10 Essential (primary) hypertension: Secondary | ICD-10-CM | POA: Diagnosis not present

## 2018-01-05 DIAGNOSIS — R1013 Epigastric pain: Secondary | ICD-10-CM | POA: Diagnosis not present

## 2018-01-05 MED ORDER — METRONIDAZOLE 500 MG PO TABS: 500.0000 mg | ORAL_TABLET | Freq: Two times a day (BID) | ORAL | 0 refills | Status: DC

## 2018-01-05 MED ORDER — FLUCONAZOLE 150 MG PO TABS: 150.0000 mg | ORAL_TABLET | ORAL | 1 refills | Status: DC

## 2018-01-05 MED ORDER — DULAGLUTIDE 1.5 MG/0.5ML ~~LOC~~ SOAJ: 1.5000 mg | SUBCUTANEOUS | 6 refills | Status: DC

## 2018-01-15 ENCOUNTER — Other Ambulatory Visit: Payer: Self-pay | Admitting: Family Medicine

## 2018-01-15 DIAGNOSIS — I10 Essential (primary) hypertension: Secondary | ICD-10-CM

## 2018-01-24 ENCOUNTER — Encounter: Payer: Self-pay | Admitting: Family Medicine

## 2018-01-24 ENCOUNTER — Ambulatory Visit: Payer: BLUE CROSS/BLUE SHIELD | Admitting: Family Medicine

## 2018-01-24 VITALS — BP 126/78 | HR 113 | Temp 98.3°F | Ht 62.5 in | Wt 186.6 lb

## 2018-01-24 DIAGNOSIS — J0100 Acute maxillary sinusitis, unspecified: Secondary | ICD-10-CM

## 2018-01-24 DIAGNOSIS — I10 Essential (primary) hypertension: Secondary | ICD-10-CM

## 2018-01-24 DIAGNOSIS — J40 Bronchitis, not specified as acute or chronic: Secondary | ICD-10-CM

## 2018-01-24 LAB — COMPREHENSIVE METABOLIC PANEL
ALT: 16 U/L (ref 0–35)
AST: 13 U/L (ref 0–37)
Albumin: 3.7 g/dL (ref 3.5–5.2)
Alkaline Phosphatase: 66 U/L (ref 39–117)
BUN: 16 mg/dL (ref 6–23)
CO2: 32 mEq/L (ref 19–32)
Calcium: 9.5 mg/dL (ref 8.4–10.5)
Chloride: 96 mEq/L (ref 96–112)
Creatinine, Ser: 1 mg/dL (ref 0.40–1.20)
GFR: 75.13 mL/min (ref >60.00)
Glucose, Bld: 83 mg/dL (ref 70–99)
Potassium: 3.1 mEq/L — ABNORMAL LOW (ref 3.5–5.1)
Sodium: 137 mEq/L (ref 135–145)
Total Bilirubin: 0.3 mg/dL (ref 0.2–1.2)
Total Protein: 7.3 g/dL (ref 6.0–8.3)

## 2018-01-24 MED ORDER — AZITHROMYCIN 250 MG PO TABS: ORAL_TABLET | ORAL | 0 refills | Status: DC

## 2018-01-24 MED ORDER — GUAIFENESIN-CODEINE 100-10 MG/5ML PO SYRP: 10.0000 mL | ORAL_SOLUTION | Freq: Every evening | ORAL | 0 refills | Status: DC | PRN

## 2018-01-24 NOTE — Progress Notes (Signed)
Carol [name] is a 52 y.o. female here for an acute visit.  History of Present Illness:   HPI: Cough, congestion, sinus pressure, with malaise over the last week. Nonsmoker. No asthma. OTC medications not helping.   PMHx, SurgHx, SocialHx, Medications, and Allergies were reviewed in the Visit Navigator and updated as appropriate.  Current Medications:   Current Outpatient Medications:  .  chlorthalidone (HYGROTON) 25 MG tablet, take 1 tablet by mouth daily, Disp: 30 tablet, Rfl: 2 .  cholecalciferol (VITAMIN D) 1000 units tablet, Take 1,000 Units by mouth daily., Disp: , Rfl:  .  Dulaglutide (TRULICITY) 1.5 XT/0.2IO SOPN, Inject 1.5 mg into the skin once a week., Disp: 4 pen, Rfl: 6 .  estradiol (ESTRACE) 0.1 MG/GM vaginal cream, 0.5 g intravaginally 1-3 times per week., Disp: 42.5 g, Rfl: 0 .  fexofenadine (ALLEGRA ODT) 30 MG disintegrating tablet, Take 30 mg daily by mouth., Disp: , Rfl:  .  losartan (COZAAR) 100 MG tablet, Take 1 tablet (100 mg total) by mouth daily., Disp: 90 tablet, Rfl: 3 .  psyllium (REGULOID) 0.52 g capsule, Take 0.52 g by mouth daily., Disp: , Rfl:  .  ranitidine (ZANTAC) 150 MG tablet, Take 150 mg by mouth 2 (two) times daily., Disp: , Rfl:    Allergies  Allergen Reactions  . Amlodipine     Other reaction(s): OTHER  . Doxazosin     Other reaction(s): OTHER  . Lisinopril Other (See Comments)    unknown   Review of Systems:   Pertinent items are noted in the HPI. Otherwise, ROS is negative.  Vitals:   Vitals:   01/24/18 1302  BP: 126/78  Pulse: (!) 113  Temp: 98.3 F (36.8 C)  TempSrc: Oral  SpO2: 96%  Weight: 186 lb 9.6 oz (84.6 kg)  Height: 5' 2.5" (1.588 m)     Body mass index is 33.59 kg/m.  Physical Exam:   Physical Exam  Constitutional: She appears well-nourished.  HENT:  Head: Normocephalic and atraumatic.  Nose: Right sinus exhibits maxillary sinus tenderness and frontal sinus tenderness. Left sinus exhibits maxillary sinus  tenderness and frontal sinus tenderness.  Eyes: Pupils are equal, round, and reactive to light. EOM are normal.  Neck: Normal range of motion. Neck supple.  Cardiovascular: Normal rate, regular rhythm, normal heart sounds and intact distal pulses.  Pulmonary/Chest: Effort normal. She has rhonchi.  Abdominal: Soft.  Skin: Skin is warm.  Psychiatric: She has a normal mood and affect. Her behavior is normal.  Nursing note and vitals reviewed.   Results for orders placed or performed in visit on 01/24/18  Comp Met (CMET)  Result Value Ref Range   Sodium 137 135 - 145 mEq/L   Potassium 3.1 (L) 3.5 - 5.1 mEq/L   Chloride 96 96 - 112 mEq/L   CO2 32 19 - 32 mEq/L   Glucose, Bld 83 70 - 99 mg/dL   BUN 16 6 - 23 mg/dL   Creatinine, Ser 1.00 0.40 - 1.20 mg/dL   Total Bilirubin 0.3 0.2 - 1.2 mg/dL   Alkaline Phosphatase 66 39 - 117 U/L   AST 13 0 - 37 U/L   ALT 16 0 - 35 U/L   Total Protein 7.3 6.0 - 8.3 g/dL   Albumin 3.7 3.5 - 5.2 g/dL   Calcium 9.5 8.4 - 10.5 mg/dL   GFR 75.13 >60.00 mL/min    Assessment and Plan:   Diagnoses and all orders for this visit:  Subacute maxillary sinusitis Comments:  With bronchitis. Orders: -     azithromycin (ZITHROMAX) 250 MG tablet; 2 po on day one, then one po daily until gone -     guaiFENesin-codeine (CHERATUSSIN AC) 100-10 MG/5ML syrup; Take 10 mLs by mouth at bedtime as needed for cough or congestion.  Hypertension, benign Comments: Labs today. Orders: -     Comp Met (CMET)    . Reviewed expectations re: course of current medical issues. . Discussed self-management of symptoms. . Outlined signs and symptoms indicating need for more acute intervention. . Patient verbalized understanding and all questions were answered. Marland Kitchen Health Maintenance issues including appropriate healthy diet, exercise, and smoking avoidance were discussed with patient. . See orders for this visit as documented in the electronic medical record. . Patient  received an After Visit Summary.  Briscoe Deutscher, DO [place], [place] 01/25/2018

## 2018-01-25 ENCOUNTER — Encounter: Payer: Self-pay | Admitting: Family Medicine

## 2018-01-29 ENCOUNTER — Other Ambulatory Visit: Payer: Self-pay

## 2018-01-29 MED ORDER — POTASSIUM CHLORIDE CRYS ER 20 MEQ PO TBCR: 20.0000 meq | EXTENDED_RELEASE_TABLET | Freq: Every day | ORAL | 1 refills | Status: DC

## 2018-04-11 NOTE — Progress Notes (Signed)
Carol [name] is a 52 y.o. female is here for follow up.  History of Present Illness:   Carol [name], CMA acting as scribe for Dr. Briscoe Deutscher.   HPI: Patient in for follow up. She has been having "heartburn" symptoms after eating for about 2 weeks. She states after eating she feels burning. Not with every meal. She does not have any problems when lying down at night. She does not get any relief from the zantac.   Health Maintenance Due  Topic Date Due  . PAP SMEAR  12/07/1987  . INFLUENZA VACCINE  03/29/2018   Depression screen [facility] 2/9 04/13/2018 08/04/2017  Decreased Interest 0 0  Down, Depressed, Hopeless 0 0  PHQ - 2 Score 0 0   PMHx, SurgHx, SocialHx, FamHx, Medications, and Allergies were reviewed in the Visit Navigator and updated as appropriate.   Patient Active Problem List   Diagnosis Date Noted  . Mixed hyperlipidemia 04/13/2018  . Morbid obesity ([place]) 01/05/2018  . Prediabetes 10/02/2017  . Dyspepsia 12/07/2012  . Chronic kidney disease, stage III (moderate) ([place]) 12/03/2010  . Hypertension, benign 12/03/2010   Social History   Tobacco Use  . Smoking status: Never Smoker  . Smokeless tobacco: Never Used  Substance Use Topics  . Alcohol use: Yes    Alcohol/week: 0.0 standard drinks    Comment: occasionally  . Drug use: No   Current Medications and Allergies:   .  chlorthalidone (HYGROTON) 25 MG tablet, take 1 tablet by mouth daily, Disp: 30 tablet, Rfl: 2 .  cholecalciferol (VITAMIN D) 1000 units tablet, Take 1,000 Units by mouth daily., Disp: , Rfl:  .  Dulaglutide (TRULICITY) 1.5 VZ/5.6LO SOPN, Inject 1.5 mg into the skin once a week., Disp: 4 pen, Rfl: 6 .  estradiol (ESTRACE) 0.1 MG/GM vaginal cream, 0.5 g intravaginally 1-3 times per week., Disp: 42.5 g, Rfl: 0 .  fexofenadine (ALLEGRA ODT) 30 MG disintegrating tablet, Take 30 mg daily by mouth., Disp: , Rfl:  .  losartan (COZAAR) 100 MG tablet, Take 1 tablet (100 mg total) by mouth daily., Disp: 90  tablet, Rfl: 3 .  potassium chloride SA (K-DUR,KLOR-CON) 20 MEQ tablet, Take 1 tablet (20 mEq total) by mouth daily. .  psyllium (REGULOID) 0.52 g capsule, Take 0.52 g by mouth daily., Disp: , Rfl:  .  ranitidine (ZANTAC) 150 MG tablet, Take 150 mg by mouth 2 (two) times daily., Disp: , Rfl:    Allergies  Allergen Reactions  . Amlodipine     Other reaction(s): OTHER  . Doxazosin     Other reaction(s): OTHER  . Lisinopril Other (See Comments)    unknown   Review of Systems   Pertinent items are noted in the HPI. Otherwise, ROS is negative.  Vitals:   Vitals:   04/13/18 1250  BP: 126/78  Pulse: 100  Temp: 98.6 F (37 C)  TempSrc: Oral  SpO2: 94%  Weight: 183 lb 12.8 oz (83.4 kg)  Height: 5' 2.5" (1.588 m)     Body mass index is 33.08 kg/m.  Physical Exam:   Physical Exam  Constitutional: She appears well-nourished.  HENT:  Head: Normocephalic and atraumatic.  Eyes: Pupils are equal, round, and reactive to light. EOM are normal.  Neck: Normal range of motion. Neck supple.  Cardiovascular: Normal rate, regular rhythm, normal heart sounds and intact distal pulses.  Pulmonary/Chest: Effort normal.  Abdominal: Soft.  Skin: Skin is warm.  Psychiatric: She has a normal mood and affect. Her behavior  is normal.  Nursing note and vitals reviewed.  Assessment and Plan:   [name] was seen today for follow-up.  Diagnoses and all orders for this visit:  Insulin resistance Comments: Recheck at next visit.   Morbid obesity ([place]) Comments: Doing well with weight loss.  Hypertension, benign Comments: Controlled. Continue current treatment.   Chronic kidney disease, stage III (moderate) (HCC)  Mixed hyperlipidemia -     Comprehensive metabolic panel -     Lipid panel  Dyspepsia Comments: Likely 2/2 constipation. Orders: -     H. pylori antibody, IgG -     Lipase -     CBC with Differential/Platelet  Drug-induced constipation Comments: See  AVS.   . Reviewed expectations re: course of current medical issues. . Discussed self-management of symptoms. . Outlined signs and symptoms indicating need for more acute intervention. . Patient verbalized understanding and all questions were answered. Marland Kitchen Health Maintenance issues including appropriate healthy diet, exercise, and smoking avoidance were discussed with patient. . See orders for this visit as documented in the electronic medical record. . Patient received an After Visit Summary.  CMA served as Education administrator during this visit. History, Physical, and Plan performed by medical provider. The above documentation has been reviewed and is accurate and complete. Briscoe Deutscher, D.O.  Briscoe Deutscher, DO [place], [facility] 04/13/2018

## 2018-04-13 ENCOUNTER — Encounter: Payer: Self-pay | Admitting: Family Medicine

## 2018-04-13 ENCOUNTER — Telehealth: Payer: Self-pay

## 2018-04-13 ENCOUNTER — Ambulatory Visit: Payer: BLUE CROSS/BLUE SHIELD | Admitting: Family Medicine

## 2018-04-13 VITALS — BP 126/78 | HR 100 | Temp 98.6°F | Ht 62.5 in | Wt 183.8 lb

## 2018-04-13 DIAGNOSIS — E8881 Metabolic syndrome: Secondary | ICD-10-CM

## 2018-04-13 DIAGNOSIS — E782 Mixed hyperlipidemia: Secondary | ICD-10-CM

## 2018-04-13 DIAGNOSIS — N183 Chronic kidney disease, stage 3 unspecified: Secondary | ICD-10-CM

## 2018-04-13 DIAGNOSIS — R7303 Prediabetes: Secondary | ICD-10-CM

## 2018-04-13 DIAGNOSIS — I10 Essential (primary) hypertension: Secondary | ICD-10-CM | POA: Diagnosis not present

## 2018-04-13 DIAGNOSIS — R1013 Epigastric pain: Secondary | ICD-10-CM | POA: Diagnosis not present

## 2018-04-13 DIAGNOSIS — K5903 Drug induced constipation: Secondary | ICD-10-CM

## 2018-04-13 LAB — CBC WITH DIFFERENTIAL/PLATELET
Basophils Absolute: 0 10*3/uL (ref 0.0–0.1)
Basophils Relative: 0.3 % (ref 0.0–3.0)
Eosinophils Absolute: 0.1 10*3/uL (ref 0.0–0.7)
Eosinophils Relative: 0.8 % (ref 0.0–5.0)
HCT: 37.1 % (ref 36.0–46.0)
Hemoglobin: 11.9 g/dL — ABNORMAL LOW (ref 12.0–15.0)
Lymphocytes Relative: 21.5 % (ref 12.0–46.0)
Lymphs Abs: 2.3 10*3/uL (ref 0.7–4.0)
MCHC: 32.1 g/dL (ref 30.0–36.0)
MCV: 74 fl — ABNORMAL LOW (ref 78.0–100.0)
Monocytes Absolute: 0.6 10*3/uL (ref 0.1–1.0)
Monocytes Relative: 5.1 % (ref 3.0–12.0)
Neutro Abs: 7.9 10*3/uL — ABNORMAL HIGH (ref 1.4–7.7)
Neutrophils Relative %: 72.3 % (ref 43.0–77.0)
Platelets: 403 10*3/uL — ABNORMAL HIGH (ref 150.0–400.0)
RBC: 5.02 Mil/uL (ref 3.87–5.11)
RDW: 15.6 % — ABNORMAL HIGH (ref 11.5–15.5)
WBC: 10.9 10*3/uL — ABNORMAL HIGH (ref 4.0–10.5)

## 2018-04-13 LAB — H. PYLORI ANTIBODY, IGG: H Pylori IgG: NEGATIVE

## 2018-04-13 LAB — LIPID PANEL
Cholesterol: 178 mg/dL (ref 0–200)
HDL: 39.5 mg/dL (ref >39.00)
LDL Cholesterol: 113 mg/dL — ABNORMAL HIGH (ref 0–99)
NonHDL: 138.01
Total CHOL/HDL Ratio: 4
Triglycerides: 126 mg/dL (ref 0.0–149.0)
VLDL: 25.2 mg/dL (ref 0.0–40.0)

## 2018-04-13 LAB — COMPREHENSIVE METABOLIC PANEL
ALT: 15 U/L (ref 0–35)
AST: 13 U/L (ref 0–37)
Albumin: 3.8 g/dL (ref 3.5–5.2)
Alkaline Phosphatase: 66 U/L (ref 39–117)
BUN: 18 mg/dL (ref 6–23)
CO2: 35 mEq/L — ABNORMAL HIGH (ref 19–32)
Calcium: 9.7 mg/dL (ref 8.4–10.5)
Chloride: 96 mEq/L (ref 96–112)
Creatinine, Ser: 1.26 mg/dL — ABNORMAL HIGH (ref 0.40–1.20)
GFR: 57.5 mL/min — ABNORMAL LOW (ref >60.00)
Glucose, Bld: 96 mg/dL (ref 70–99)
Potassium: 2.8 mEq/L — CL (ref 3.5–5.1)
Sodium: 136 mEq/L (ref 135–145)
Total Bilirubin: 0.3 mg/dL (ref 0.2–1.2)
Total Protein: 7.4 g/dL (ref 6.0–8.3)

## 2018-04-13 LAB — LIPASE: Lipase: 26 U/L (ref 11.0–59.0)

## 2018-04-13 NOTE — Telephone Encounter (Signed)
Please advise 

## 2018-04-13 NOTE — Patient Instructions (Signed)
MANAGEMENT OF CHRONIC CONSTIPATION   Push fluids, all day, everyday  Eat lots of high fiber foods-fruits, veggies, bran and whole grain instead of white bread  Be active everyday. Inactivity makes constipation worse.  Add psyllium daily (Metamucil) which comes in capsules now. Start very low dose and work up to recommended dose on bottle daily.  If that is not working, I would start Miralax, which you can buy in generic 17 gms daily. It's a powder and not an "addictive laxative". Take it every day and titrate the dose up or down to get the daily Bm.  Stay away from [place] or any magnesium containing laxative, unless you need it to clear things out rarely. It is an addictive laxative and your gut will become dependent on it.

## 2018-04-13 NOTE — Telephone Encounter (Signed)
Please call patient and let her know that her potassium came back quite low. She should double her potassium supplement to 20 mEq twice daily starting today.  Return to office Monday for repeat labs.  Inda Coke PA-C

## 2018-04-13 NOTE — Telephone Encounter (Signed)
[facility] lab called with critical result on patient. Potassium 2.8

## 2018-04-13 NOTE — Telephone Encounter (Signed)
Called patient and left a voicemail message asking patient to double her potassium over the weekend. I asked her to call our office and schedule a lab appointment on Monday and provided a telephone number for questions.

## 2018-04-16 ENCOUNTER — Other Ambulatory Visit: Payer: Self-pay

## 2018-04-16 ENCOUNTER — Telehealth: Payer: Self-pay

## 2018-04-16 ENCOUNTER — Other Ambulatory Visit (INDEPENDENT_AMBULATORY_CARE_PROVIDER_SITE_OTHER): Payer: BLUE CROSS/BLUE SHIELD

## 2018-04-16 ENCOUNTER — Other Ambulatory Visit: Payer: Self-pay | Admitting: Family Medicine

## 2018-04-16 ENCOUNTER — Other Ambulatory Visit: Payer: BLUE CROSS/BLUE SHIELD

## 2018-04-16 DIAGNOSIS — E876 Hypokalemia: Secondary | ICD-10-CM | POA: Diagnosis not present

## 2018-04-16 DIAGNOSIS — I10 Essential (primary) hypertension: Secondary | ICD-10-CM

## 2018-04-16 LAB — POTASSIUM: Potassium: 3.5 mEq/L (ref 3.5–5.1)

## 2018-04-16 NOTE — Telephone Encounter (Signed)
Order to be placed.

## 2018-04-16 NOTE — Telephone Encounter (Signed)
Pt coming for repeat labs this morning. Please place future order.

## 2018-04-17 ENCOUNTER — Other Ambulatory Visit: Payer: Self-pay | Admitting: Family Medicine

## 2018-04-18 NOTE — Telephone Encounter (Signed)
Go to BID dosing.

## 2018-04-18 NOTE — Telephone Encounter (Signed)
Do you want her to continue at current dose or change?

## 2018-05-17 ENCOUNTER — Other Ambulatory Visit: Payer: Self-pay | Admitting: Family Medicine

## 2018-05-18 NOTE — Telephone Encounter (Signed)
Please advise on refill.

## 2018-06-07 ENCOUNTER — Other Ambulatory Visit: Payer: Self-pay | Admitting: Obstetrics & Gynecology

## 2018-06-07 DIAGNOSIS — Z1231 Encounter for screening mammogram for malignant neoplasm of breast: Secondary | ICD-10-CM

## 2018-06-14 ENCOUNTER — Other Ambulatory Visit: Payer: Self-pay | Admitting: Family Medicine

## 2018-06-15 NOTE — Telephone Encounter (Signed)
Please advise on refill.

## 2018-07-13 ENCOUNTER — Ambulatory Visit: Payer: BLUE CROSS/BLUE SHIELD | Admitting: Family Medicine

## 2018-07-15 NOTE — Progress Notes (Signed)
Carol [name] is a 52 y.o. female is here for follow up.  History of Present Illness:   Carol [name], CMA acting as scribe for Dr. Briscoe Deutscher.   HPI:   Insulin resistance Patient was started on Trulicity. She is on 1.5mg  with no side effects. She is working on avoiding sugar and portion control.    Morbid obesity ([name]) At last office visit patient was doing well at weight loss. She has not lost any additional weight. She as the same as last office visit. She admits that the Halloween was hard with all the sweets.   Hypertension, benign Patient was taking losartan 100mg  but due to all the recalls was changed to olmesartan 20 by her kidney doctor.  Review: taking medications as instructed, no medication side effects noted, no TIAs, no chest pain on exertion, no dyspnea on exertion, no swelling of ankles. Smoker: No.  Chronic kidney disease, stage III (moderate) (HCC)  Mixed hyperlipidemia Is the patient taking medications without problems? [x]   YES  []   NO Does the patient complain of muscle aches?   []   YES  [x]    NO Trying to exercise on a regular basis? [x]   YES  []   NO Compliant with diet? [x]   YES  []   NO   Dyspepsia Patient had Negative H Pylori IgG done on 04/13/18  Health Maintenance Due  Topic Date Due  . PAP SMEAR  12/07/1987   Depression screen [facility] 2/9 04/13/2018 08/04/2017  Decreased Interest 0 0  Down, Depressed, Hopeless 0 0  PHQ - 2 Score 0 0   PMHx, SurgHx, SocialHx, FamHx, Medications, and Allergies were reviewed in the Visit Navigator and updated as appropriate.   Patient Active Problem List   Diagnosis Date Noted  . Hypokalemia 07/18/2018  . Menopausal vaginal dryness 07/18/2018  . Essential hypertension 07/18/2018  . Weight gain 07/18/2018  . Mixed hyperlipidemia 04/13/2018  . Morbid obesity ([place]) 01/05/2018  . Prediabetes 10/02/2017  . Dyspepsia 12/07/2012  . Chronic kidney disease, stage III (moderate) (HCC) 12/03/2010   Social  History   Tobacco Use  . Smoking status: Never Smoker  . Smokeless tobacco: Never Used  Substance Use Topics  . Alcohol use: Yes    Comment: occasionally  . Drug use: No   Current Medications and Allergies:   Current Outpatient Medications:  .  cholecalciferol (VITAMIN D) 1000 units tablet, Take 1,000 Units by mouth daily., Disp: , Rfl:  .  fexofenadine (ALLEGRA ODT) 30 MG disintegrating tablet, Take 30 mg daily by mouth., Disp: , Rfl:  .  olmesartan (BENICAR) 20 MG tablet, Take 20 mg by mouth daily., Disp: , Rfl:  .  ranitidine (ZANTAC) 150 MG tablet, Take 150 mg by mouth 2 (two) times daily., Disp: , Rfl:  .  Dulaglutide (TRULICITY) 1.5 ZO/1.0RU SOPN, Inject 1.5 mg into the skin once a week., Disp: 4 pen, Rfl: 6 .  spironolactone (ALDACTONE) 50 MG tablet, Take 1 tablet (50 mg total) by mouth daily., Disp: 90 tablet, Rfl: 0   Allergies  Allergen Reactions  . Amlodipine     Other reaction(s): OTHER  . Doxazosin     Other reaction(s): OTHER  . Lisinopril Other (See Comments)    unknown   Review of Systems   Pertinent items are noted in the HPI. Otherwise, ROS is negative.  Vitals:   Vitals:   07/16/18 1108  BP: 120/62  Pulse: 97  Temp: 98.2 F (36.8 C)  TempSrc: Oral  SpO2:  97%  Weight: 183 lb (83 kg)  Height: 5' 2.5" (1.588 m)     Body mass index is 32.94 kg/m.  Physical Exam:   Physical Exam  Constitutional: She appears well-nourished.  HENT:  Head: Normocephalic and atraumatic.  Eyes: Pupils are equal, round, and reactive to light. EOM are normal.  Neck: Normal range of motion. Neck supple.  Cardiovascular: Normal rate, regular rhythm, normal heart sounds and intact distal pulses.  Pulmonary/Chest: Effort normal.  Abdominal: Soft.  Skin: Skin is warm.  Facial acne.  Psychiatric: She has a normal mood and affect. Her behavior is normal.  Nursing note and vitals reviewed.  Assessment and Plan:   Hypokalemia History: Potassium consistently low  despite replacement. Cardiovascular ROS: no chest pain or dyspnea on exertion. Gastrointestinal ROS: no abdominal pain, change in bowel habits, or black or bloody stools. Assessment/Plan: Patient prefers to take Spironolactone due to decreased acne when taking. Okay to transition to it, even though on ARB. Will monitor closely. Recheck BMP and Mag in 1-2 weeks. If continuing to have a difficult time regulating, will send back to Nephrology.  Essential hypertension History:  BP Readings from Last 3 Encounters:  07/16/18 120/76  07/16/18 120/62  04/13/18 126/78   Lab Results  Component Value Date   CREATININE 1.23 (H) 07/16/2018     Assessment/Plan: 1. Medication: stop chlorthalidone/K, start spironolactone, recheck labs in 1 week. 2. Dietary sodium restriction. 3. Regular aerobic exercise. 4. Check blood pressures daily and record.  Chronic kidney disease, stage III (moderate) (HCC) Assessment/Plan: CKD: stage 3 - GFR 30-59 stable. Plan: ACE inhibitor therapy, blood pressure control, avoid NSAIDs and consult physician early if nausea, vomiting, or diarrhea.   Mixed hyperlipidemia History:  Lab Results  Component Value Date   CHOL 178 04/13/2018   HDL 39.50 04/13/2018   LDLCALC 113 (H) 04/13/2018   TRIG 126.0 04/13/2018   CHOLHDL 4 04/13/2018   Lab Results  Component Value Date   ALT 18 07/16/2018   AST 14 07/16/2018   ALKPHOS 60 07/16/2018   BILITOT 0.3 07/16/2018     Assessment/Plan: Dyslipidemia under fair control. Continue dietary measures. Continue regular exercise, an average 40 minutes of moderate to vigorous-intensity aerobic activity 3 or 4 times per week. Lipid-lowering medications: will consider after next lab check, will work on weight loss.  Prediabetes History: Current symptoms: sweet tooth. Weight trend: increasing steadily. Prior visit with dietician: no Current diet: in general, an "unhealthy" diet Current exercise: none  Lab Results  Component  Value Date   HGBA1C 6.1 08/04/2017   GLUCOSE 90 07/16/2018   GLUCOSE 96 04/13/2018   GLUCOSE 83 01/24/2018   Assessment/Plan: 5. [name] will continue to work on weight loss, exercise, and decreasing simple carbohydrates in her diet to help decrease the risk of diabetes.  6. Medication: see orders.  Morbid obesity ([place]) History:  Wt Readings from Last 3 Encounters:  07/16/18 184 lb 9.6 oz (83.7 kg)  07/16/18 183 lb (83 kg)  04/13/18 183 lb 12.8 oz (83.4 kg)   Assessment/Plan: The patient is asked to make an attempt to improve diet and exercise patterns to aid in medical management of this problem. Recheck at next visit.   . Reviewed expectations re: course of current medical issues. . Discussed self-management of symptoms. . Outlined signs and symptoms indicating need for more acute intervention. . Patient verbalized understanding and all questions were answered. Marland Kitchen Health Maintenance issues including appropriate healthy diet, exercise, and smoking avoidance were discussed with  patient. . See orders for this visit as documented in the electronic medical record. . Patient received an After Visit Summary.  CMA served as Education administrator during this visit. History, Physical, and Plan performed by medical provider. The above documentation has been reviewed and is accurate and complete. Briscoe Deutscher, D.O.  Briscoe Deutscher, DO [place], [facility] 07/18/2018

## 2018-07-16 ENCOUNTER — Ambulatory Visit
Admission: RE | Admit: 2018-07-16 | Discharge: 2018-07-16 | Disposition: A | Discharge status: Home / Self Care | Payer: BLUE CROSS/BLUE SHIELD | Source: Ambulatory Visit | Attending: Obstetrics & Gynecology | Admitting: Obstetrics & Gynecology

## 2018-07-16 ENCOUNTER — Other Ambulatory Visit: Payer: Self-pay

## 2018-07-16 ENCOUNTER — Ambulatory Visit: Payer: BLUE CROSS/BLUE SHIELD | Admitting: Obstetrics & Gynecology

## 2018-07-16 ENCOUNTER — Ambulatory Visit: Payer: BLUE CROSS/BLUE SHIELD | Admitting: Family Medicine

## 2018-07-16 ENCOUNTER — Encounter: Payer: Self-pay | Admitting: Obstetrics & Gynecology

## 2018-07-16 ENCOUNTER — Other Ambulatory Visit: Payer: Self-pay | Admitting: Family Medicine

## 2018-07-16 ENCOUNTER — Encounter: Payer: Self-pay | Admitting: Family Medicine

## 2018-07-16 VITALS — BP 120/76 | HR 104 | Resp 16 | Ht 62.5 in | Wt 184.6 lb

## 2018-07-16 VITALS — BP 120/62 | HR 97 | Temp 98.2°F | Ht 62.5 in | Wt 183.0 lb

## 2018-07-16 DIAGNOSIS — I10 Essential (primary) hypertension: Secondary | ICD-10-CM

## 2018-07-16 DIAGNOSIS — L659 Nonscarring hair loss, unspecified: Secondary | ICD-10-CM

## 2018-07-16 DIAGNOSIS — E876 Hypokalemia: Secondary | ICD-10-CM

## 2018-07-16 DIAGNOSIS — N898 Other specified noninflammatory disorders of vagina: Secondary | ICD-10-CM

## 2018-07-16 DIAGNOSIS — N183 Chronic kidney disease, stage 3 unspecified: Secondary | ICD-10-CM

## 2018-07-16 DIAGNOSIS — E782 Mixed hyperlipidemia: Secondary | ICD-10-CM

## 2018-07-16 DIAGNOSIS — N951 Menopausal and female climacteric states: Secondary | ICD-10-CM

## 2018-07-16 DIAGNOSIS — Z0000 Encounter for general adult medical examination without abnormal findings: Secondary | ICD-10-CM | POA: Diagnosis not present

## 2018-07-16 DIAGNOSIS — R635 Abnormal weight gain: Secondary | ICD-10-CM | POA: Diagnosis not present

## 2018-07-16 DIAGNOSIS — E8881 Metabolic syndrome: Secondary | ICD-10-CM

## 2018-07-16 DIAGNOSIS — Z1231 Encounter for screening mammogram for malignant neoplasm of breast: Secondary | ICD-10-CM | POA: Diagnosis not present

## 2018-07-16 DIAGNOSIS — Z01419 Encounter for gynecological examination (general) (routine) without abnormal findings: Secondary | ICD-10-CM

## 2018-07-16 LAB — COMPREHENSIVE METABOLIC PANEL
ALT: 18 U/L (ref 0–35)
AST: 14 U/L (ref 0–37)
Albumin: 3.9 g/dL (ref 3.5–5.2)
Alkaline Phosphatase: 60 U/L (ref 39–117)
BUN: 18 mg/dL (ref 6–23)
CO2: 33 mEq/L — ABNORMAL HIGH (ref 19–32)
Calcium: 9.6 mg/dL (ref 8.4–10.5)
Chloride: 98 mEq/L (ref 96–112)
Creatinine, Ser: 1.23 mg/dL — ABNORMAL HIGH (ref 0.40–1.20)
GFR: 59.06 mL/min — ABNORMAL LOW (ref >60.00)
Glucose, Bld: 90 mg/dL (ref 70–99)
Potassium: 2.9 mEq/L — ABNORMAL LOW (ref 3.5–5.1)
Sodium: 138 mEq/L (ref 135–145)
Total Bilirubin: 0.3 mg/dL (ref 0.2–1.2)
Total Protein: 7.6 g/dL (ref 6.0–8.3)

## 2018-07-16 LAB — POCT URINALYSIS DIPSTICK
Bilirubin, UA: NEGATIVE
Blood, UA: NEGATIVE
GLUCOSE UA: NEGATIVE
KETONES UA: NEGATIVE
Leukocytes, UA: NEGATIVE
NITRITE UA: NEGATIVE
PROTEIN UA: NEGATIVE
Urobilinogen, UA: 0.2 E.U./dL
pH, UA: 5 (ref 5.0–8.0)

## 2018-07-16 MED ORDER — BUPROPION HCL ER (XL) 150 MG PO TB24: 150.0000 mg | ORAL_TABLET | Freq: Every day | ORAL | 3 refills | Status: DC

## 2018-07-16 MED ORDER — DULAGLUTIDE 1.5 MG/0.5ML ~~LOC~~ SOAJ: 1.5000 mg | SUBCUTANEOUS | 6 refills | Status: DC

## 2018-07-16 MED ORDER — ESTRADIOL 0.1 MG/GM VA CREA: TOPICAL_CREAM | VAGINAL | 0 refills | Status: DC

## 2018-07-16 NOTE — Patient Instructions (Addendum)
Health Maintenance Due  Topic Date Due  . PAP SMEAR  We will get report from GYN  12/07/1987

## 2018-07-16 NOTE — Progress Notes (Signed)
52 y.o. G62P2002 Married Carol [name] here for annual exam.  Doing well.  Did have colonoscopy in January.  Denies vaginal bleeding.  Reports she has experienced from hair thinning this year.  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    Patient's last menstrual period was 11/27/2005.          Sexually active: Yes.    The current method of family planning is status post hysterectomy.    Exercising: No.   Smoker:  no  Health Maintenance: Pap:  2007 neg  History of abnormal Pap:  no MMG:  07/11/17 BIRADS1:Neg. Has appt today  Colonoscopy:  09/18/17 polyp. F/u 10 years  BMD:   never TDaP:  2016 Pneumonia vaccine(s):  n/a Shingrix:  D/w pt today. Hep C testing: n/a Screening Labs: PCP   reports that she has never smoked. She has never used smokeless tobacco. She reports that she drinks alcohol. She reports that she does not use drugs.  Past Medical History:  Diagnosis Date  . Allergy   . Anemia   . Arthritis   . Chicken pox   . GERD (gastroesophageal reflux disease)   . Hay fever   . High cholesterol   . Hypertension   . Kidney disease 2011    Past Surgical History:  Procedure Laterality Date  . ABDOMINAL HYSTERECTOMY  2007  . CESAREAN SECTION      Current Outpatient Medications  Medication Sig Dispense Refill  . chlorthalidone (HYGROTON) 25 MG tablet TAKE ONE TABLET BY MOUTH DAILY  30 tablet 2  . cholecalciferol (VITAMIN D) 1000 units tablet Take 1,000 Units by mouth daily.    . Dulaglutide (TRULICITY) 1.5 DZ/3.2DJ SOPN Inject 1.5 mg into the skin once a week. 4 pen 6  . fexofenadine (ALLEGRA ODT) 30 MG disintegrating tablet Take 30 mg daily by mouth.    . olmesartan (BENICAR) 20 MG tablet Take 20 mg by mouth daily.    . potassium chloride SA (K-DUR,KLOR-CON) 20 MEQ tablet TAKE ONE TABLET BY MOUTH ONE TIME DAILY  30 tablet 0  . ranitidine (ZANTAC) 150 MG tablet Take 150 mg by mouth 2 (two) times daily.    Marland Kitchen buPROPion (WELLBUTRIN XL) 150 MG 24 hr tablet  Take 1 tablet (150 mg total) by mouth daily. (Patient not taking: Reported on 07/16/2018) 30 tablet 3  . estradiol (ESTRACE) 0.1 MG/GM vaginal cream 0.5 g intravaginally 1-3 times per week. (Patient not taking: Reported on 07/16/2018) 42.5 g 0   No current facility-administered medications for this visit.     Family History  Problem Relation Age of Onset  . Hypertension Mother   . High Cholesterol Mother   . Kidney disease Mother   . Colon polyps Mother   . Hypertension Father   . Hypertension Sister   . High Cholesterol Brother   . Kidney disease Brother   . Hypertension Maternal Grandmother   . Heart attack Maternal Grandfather   . Depression Paternal Grandfather   . Kidney disease Paternal Grandfather   . Colon cancer Neg Hx   . Esophageal cancer Neg Hx   . Rectal cancer Neg Hx   . Stomach cancer Neg Hx     Review of Systems  All other systems reviewed and are negative.   Exam:   BP 120/76 (BP Location: Right Arm, Patient Position: Sitting, Cuff Size: Large)   Pulse (!) 104   Resp 16   Ht 5' 2.5" (1.588 m)   Wt 184 lb  9.6 oz (83.7 kg)   LMP 11/27/2005   BMI 33.23 kg/m      Height: 5' 2.5" (158.8 cm)  Ht Readings from Last 3 Encounters:  07/16/18 5' 2.5" (1.588 m)  07/16/18 5' 2.5" (1.588 m)  04/13/18 5' 2.5" (1.588 m)    General appearance: alert, cooperative and appears stated age Head: Normocephalic, without obvious abnormality, atraumatic Neck: no adenopathy, supple, symmetrical, trachea midline and thyroid normal to inspection and palpation Lungs: clear to auscultation bilaterally Breasts: normal appearance, no masses or tenderness Heart: regular rate and rhythm Abdomen: soft, non-tender; bowel sounds normal; no masses,  no organomegaly Extremities: extremities normal, atraumatic, no cyanosis or edema Skin: Skin color, texture, turgor normal. No rashes or lesions Lymph nodes: Cervical, supraclavicular, and axillary nodes normal. No abnormal inguinal nodes  palpated Neurologic: Grossly normal   Pelvic: External genitalia:  no lesions              Urethra:  normal appearing urethra with no masses, tenderness or lesions              Bartholins and Skenes: normal                 Vagina: normal appearing vagina with normal color.  Whitish discharge noted.  no lesions              Cervix: absent              Pap taken: No. Bimanual Exam:  Uterus:  uterus absent              Adnexa: no mass, fullness, tenderness               Rectovaginal: Confirms               Anus:  normal sphincter tone, no lesions  Chaperone was present for exam.  A:  Well Woman with normal exam H/O TAH 2007 due to fibroids Vaginal discharge and odor Hypertension Chronic kidney disease, stage III Hair thinning  P:   Mammogram guidelines reviewed pap smear not obtained Vaginitis testing obtained today FSH, estradiol, and TSH obtained today Vaccines UTD except shingrix.  Reviewed with her today. return annually or prn

## 2018-07-16 NOTE — Telephone Encounter (Signed)
Ok to fill you wanted to hold off for labs.

## 2018-07-17 LAB — FOLLICLE STIMULATING HORMONE: FSH: 70.9 m[IU]/mL

## 2018-07-17 LAB — ESTRADIOL: Estradiol: 24.1 pg/mL

## 2018-07-17 LAB — TSH: TSH: 3.48 u[IU]/mL (ref 0.450–4.500)

## 2018-07-18 ENCOUNTER — Encounter: Payer: Self-pay | Admitting: Family Medicine

## 2018-07-18 DIAGNOSIS — N951 Menopausal and female climacteric states: Secondary | ICD-10-CM | POA: Insufficient documentation

## 2018-07-18 DIAGNOSIS — E876 Hypokalemia: Secondary | ICD-10-CM | POA: Insufficient documentation

## 2018-07-18 DIAGNOSIS — R635 Abnormal weight gain: Secondary | ICD-10-CM | POA: Insufficient documentation

## 2018-07-18 DIAGNOSIS — T502X5A Adverse effect of carbonic-anhydrase inhibitors, benzothiadiazides and other diuretics, initial encounter: Secondary | ICD-10-CM | POA: Insufficient documentation

## 2018-07-18 DIAGNOSIS — I10 Essential (primary) hypertension: Secondary | ICD-10-CM | POA: Insufficient documentation

## 2018-07-18 LAB — NUSWAB VAGINITIS PLUS (VG+)
Atopobium vaginae: HIGH Score — AB
BVAB 2: HIGH Score — AB
CANDIDA ALBICANS, NAA: NEGATIVE
CANDIDA GLABRATA, NAA: NEGATIVE
CHLAMYDIA TRACHOMATIS, NAA: NEGATIVE
MEGASPHAERA 1: HIGH {score} — AB
NEISSERIA GONORRHOEAE, NAA: NEGATIVE
TRICH VAG BY NAA: NEGATIVE

## 2018-07-18 MED ORDER — SPIRONOLACTONE 50 MG PO TABS: 50.0000 mg | ORAL_TABLET | Freq: Every day | ORAL | 0 refills | Status: DC

## 2018-07-18 NOTE — Assessment & Plan Note (Signed)
History:  Lab Results  Component Value Date   CHOL 178 04/13/2018   HDL 39.50 04/13/2018   LDLCALC 113 (H) 04/13/2018   TRIG 126.0 04/13/2018   CHOLHDL 4 04/13/2018   Lab Results  Component Value Date   ALT 18 07/16/2018   AST 14 07/16/2018   ALKPHOS 60 07/16/2018   BILITOT 0.3 07/16/2018     Assessment/Plan: Dyslipidemia under fair control. Continue dietary measures. Continue regular exercise, an average 40 minutes of moderate to vigorous-intensity aerobic activity 3 or 4 times per week. Lipid-lowering medications: will consider after next lab check, will work on weight loss.

## 2018-07-18 NOTE — Assessment & Plan Note (Signed)
History:  Wt Readings from Last 3 Encounters:  07/16/18 184 lb 9.6 oz (83.7 kg)  07/16/18 183 lb (83 kg)  04/13/18 183 lb 12.8 oz (83.4 kg)   Assessment/Plan: The patient is asked to make an attempt to improve diet and exercise patterns to aid in medical management of this problem. Recheck at next visit.

## 2018-07-18 NOTE — Addendum Note (Signed)
Addended by: Briscoe Deutscher R on: 07/18/2018 06:15 AM   Modules accepted: Orders

## 2018-07-18 NOTE — Assessment & Plan Note (Signed)
History: Current symptoms: sweet tooth. Weight trend: increasing steadily. Prior visit with dietician: no Current diet: in general, an "unhealthy" diet Current exercise: none  Lab Results  Component Value Date   HGBA1C 6.1 08/04/2017   GLUCOSE 90 07/16/2018   GLUCOSE 96 04/13/2018   GLUCOSE 83 01/24/2018   Assessment/Plan: 1. Carol [name] will continue to work on weight loss, exercise, and decreasing simple carbohydrates in her diet to help decrease the risk of diabetes.  2. Medication: see orders.

## 2018-07-18 NOTE — Assessment & Plan Note (Signed)
Assessment/Plan: CKD: stage 3 - GFR 30-59 stable. Plan: ACE inhibitor therapy, blood pressure control, avoid NSAIDs and consult physician early if nausea, vomiting, or diarrhea.

## 2018-07-18 NOTE — Assessment & Plan Note (Signed)
History: Potassium consistently low despite replacement. Cardiovascular ROS: no chest pain or dyspnea on exertion. Gastrointestinal ROS: no abdominal pain, change in bowel habits, or black or bloody stools. Assessment/Plan: Patient prefers to take Spironolactone due to decreased acne when taking. Okay to transition to it, even though on ARB. Will monitor closely. Recheck BMP and Mag in 1-2 weeks. If continuing to have a difficult time regulating, will send back to Nephrology.

## 2018-07-18 NOTE — Assessment & Plan Note (Signed)
History:  BP Readings from Last 3 Encounters:  07/16/18 120/76  07/16/18 120/62  04/13/18 126/78   Lab Results  Component Value Date   CREATININE 1.23 (H) 07/16/2018     Assessment/Plan: 1. Medication: stop chlorthalidone/K, start spironolactone, recheck labs in 1 week. 2. Dietary sodium restriction. 3. Regular aerobic exercise. 4. Check blood pressures daily and record.

## 2018-07-19 ENCOUNTER — Telehealth: Payer: Self-pay

## 2018-07-19 DIAGNOSIS — N760 Acute vaginitis: Principal | ICD-10-CM

## 2018-07-19 DIAGNOSIS — B9689 Other specified bacterial agents as the cause of diseases classified elsewhere: Secondary | ICD-10-CM

## 2018-07-19 MED ORDER — METRONIDAZOLE 500 MG PO TABS: 500.0000 mg | ORAL_TABLET | Freq: Two times a day (BID) | ORAL | 0 refills | Status: DC

## 2018-07-19 NOTE — Telephone Encounter (Signed)
-----   Message from Megan Salon, MD sent at 07/18/2018  5:55 AM EST ----- Please let pt know her vaginitis testing showed BV.  Ok to treat with Metrogel 5.97%, one applicator QHS x 5 nights OR flagyl 500mg  bid x 7 days.  If pt chooses oral medication, please advise no ETOH while on medication.    Blood work showed normal thyroid and that she is fully in menopause now with low estrogen level and high FSH.  I do want to know if the discharge and odor do or do not resolve after the treatment so please ask her to call to give an update.  Thanks.

## 2018-07-19 NOTE — Telephone Encounter (Signed)
Called patient and notified her of positive BV testing. Patient chose to take Flagyl 500mg bid x 7 day. Medication called in to [facility] in [place]. Patient will call back after finishing medication to update.

## 2018-08-01 ENCOUNTER — Ambulatory Visit: Payer: Self-pay | Admitting: *Deleted

## 2018-08-01 NOTE — Telephone Encounter (Signed)
Pt calling stating that she has been taking Spironolactone for about 2 weeks and is concerned that the medication may be causing her to have shortness of breath with bending over. Pt states she has experienced shortness of breath with bending over since Monday of this week. pt states that her abdomen feels full. Pt states she has been urinating but she has noted that she does not urinate as much as she did when she was on the other medication. Pt states she does feel like she is emptying he bladder when she urinates and states she has been able to have BMs. Pt denies chest pain, dizziness and states she does not feel like she is struggling to breath. Pt offered to make appt for evaluation but pt declines at this time. Pt states she wants to make Dr. Juleen China aware of current symptoms and then she would come in for appt if needed. Pt advised that is symptoms become worse to seek treatment in the ED.   Reason for Disposition . [1] MILD difficulty breathing (e.g., minimal/no SOB at rest, SOB with walking, pulse <100) AND [2] NEW-onset or WORSE than normal  Answer Assessment - Initial Assessment Questions 1. RESPIRATORY STATUS: "Describe your breathing?" (e.g., wheezing, shortness of breath, unable to speak, severe coughing)      Shortness of breath with bending over 2. ONSET: "When did this breathing problem begin?"      Monday of this week 3. PATTERN "Does the difficult breathing come and go, or has it been constant since it started?"      Comes and goes, does not have shortness of breath with sitting or doing other activity 4. SEVERITY: "How bad is your breathing?" (e.g., mild, moderate, severe)    - MILD: No SOB at rest, mild SOB with walking, speaks normally in sentences, can lay down, no retractions, pulse < 100.    - MODERATE: SOB at rest, SOB with minimal exertion and prefers to sit, cannot lie down flat, speaks in phrases, mild retractions, audible wheezing, pulse 100-120.    - SEVERE: Very SOB at  rest, speaks in single words, struggling to breathe, sitting hunched forward, retractions, pulse > 120      mild 5. RECURRENT SYMPTOM: "Have you had difficulty breathing before?" If so, ask: "When was the last time?" and "What happened that time?"      No 6. CARDIAC HISTORY: "Do you have any history of heart disease?" (e.g., heart attack, angina, bypass surgery, angioplasty)      No, but has history of HTN 7. LUNG HISTORY: "Do you have any history of lung disease?"  (e.g., pulmonary embolus, asthma, emphysema)     No 8. CAUSE: "What do you think is causing the breathing problem?"      Thinks it is caused by Spironolactone 9. OTHER SYMPTOMS: "Do you have any other symptoms? (e.g., dizziness, runny nose, cough, chest pain, fever)     No 10. PREGNANCY: "Is there any chance you are pregnant?" "When was your last menstrual period?"       Not assessed 11. TRAVEL: "Have you traveled out of the country in the last month?" (e.g., travel history, exposures)       Not assessed  Protocols used: BREATHING DIFFICULTY-A-AH

## 2018-08-02 NOTE — Telephone Encounter (Signed)
Called patient app made for Monday (first day she could come in) she was informed to go to ED if changes or increase in symptoms.

## 2018-08-02 NOTE — Telephone Encounter (Signed)
See note

## 2018-08-03 NOTE — Telephone Encounter (Signed)
Can she come in today for labs (already in as future order) because we need to make sure kidney function and potassium okay? Weight gain? Spironolactone not as strong as previous diuretic. If labs okay and gaining fluid, will likely ask her to take an extra diuretic (labs will decide which one we choose).

## 2018-08-06 ENCOUNTER — Encounter: Payer: Self-pay | Admitting: Family Medicine

## 2018-08-06 ENCOUNTER — Ambulatory Visit: Payer: BLUE CROSS/BLUE SHIELD | Admitting: Family Medicine

## 2018-08-06 VITALS — BP 138/84 | HR 96 | Temp 98.6°F | Ht 62.5 in | Wt 192.0 lb

## 2018-08-06 DIAGNOSIS — I10 Essential (primary) hypertension: Secondary | ICD-10-CM

## 2018-08-06 DIAGNOSIS — N183 Chronic kidney disease, stage 3 unspecified: Secondary | ICD-10-CM

## 2018-08-06 DIAGNOSIS — E876 Hypokalemia: Secondary | ICD-10-CM | POA: Diagnosis not present

## 2018-08-06 DIAGNOSIS — R600 Localized edema: Secondary | ICD-10-CM | POA: Diagnosis not present

## 2018-08-06 DIAGNOSIS — J0100 Acute maxillary sinusitis, unspecified: Secondary | ICD-10-CM

## 2018-08-06 LAB — COMPREHENSIVE METABOLIC PANEL
ALT: 24 U/L (ref 0–35)
AST: 18 U/L (ref 0–37)
Albumin: 3.9 g/dL (ref 3.5–5.2)
Alkaline Phosphatase: 60 U/L (ref 39–117)
BUN: 16 mg/dL (ref 6–23)
CO2: 29 mEq/L (ref 19–32)
Calcium: 9.2 mg/dL (ref 8.4–10.5)
Chloride: 103 mEq/L (ref 96–112)
Creatinine, Ser: 1.14 mg/dL (ref 0.40–1.20)
GFR: 64.45 mL/min (ref >60.00)
Glucose, Bld: 101 mg/dL — ABNORMAL HIGH (ref 70–99)
Potassium: 3.7 mEq/L (ref 3.5–5.1)
Sodium: 138 mEq/L (ref 135–145)
Total Bilirubin: 0.2 mg/dL (ref 0.2–1.2)
Total Protein: 7.1 g/dL (ref 6.0–8.3)

## 2018-08-06 MED ORDER — AMOXICILLIN 875 MG PO TABS: 875.0000 mg | ORAL_TABLET | Freq: Two times a day (BID) | ORAL | 0 refills | Status: DC

## 2018-08-06 NOTE — Progress Notes (Signed)
Carol [name] is a 52 y.o. female is here for follow up.  History of Present Illness:   HPI:   From Triage. Patient  stating that she has been taking Spironolactone for about 2 weeks and is concerned that the medication may be causing her to have shortness of breath with bending over. Pt states she has experienced shortness of breath with bending over since Monday of this week. pt states that her abdomen feels full. Pt states she has been urinating but she has noted that she does not urinate as much as she did when she was on the other medication. Pt states she does feel like she is emptying he bladder when she urinates and states she has been able to have BMs. Pt denies chest pain, dizziness and states she does not feel like she is struggling to breath. Patient offered to make appt for evaluation but pt declines at this time. Patient started having improvement over the weekend. She is no longer having any shortness of breath with bending.  Also, she has additional complaints of sinus pain and pressure for 2 weeks. No fever or cough.  Health Maintenance Due  Topic Date Due  . PAP SMEAR-Modifier  12/07/1987   Depression screen [facility] 2/9 04/13/2018 08/04/2017  Decreased Interest 0 0  Down, Depressed, Hopeless 0 0  PHQ - 2 Score 0 0   PMHx, SurgHx, SocialHx, FamHx, Medications, and Allergies were reviewed in the Visit Navigator and updated as appropriate.   Patient Active Problem List   Diagnosis Date Noted  . Hypokalemia 07/18/2018  . Menopausal vaginal dryness 07/18/2018  . Essential hypertension 07/18/2018  . Weight gain 07/18/2018  . Mixed hyperlipidemia 04/13/2018  . Morbid obesity ([place]) 01/05/2018  . Prediabetes 10/02/2017  . Dyspepsia 12/07/2012  . Chronic kidney disease, stage III (moderate) (HCC) 12/03/2010   Social History   Tobacco Use  . Smoking status: Never Smoker  . Smokeless tobacco: Never Used  Substance Use Topics  . Alcohol use: Yes    Comment: occasionally   . Drug use: No   Current Medications and Allergies:   .  cholecalciferol (VITAMIN D) 1000 units tablet, Take 1,000 Units by mouth daily., Disp: , Rfl:  .  Dulaglutide (TRULICITY) 1.5 TM/2.2QJ SOPN, Inject 1.5 mg into the skin once a week., Disp: 4 pen, Rfl: 6 .  fexofenadine (ALLEGRA ODT) 30 MG disintegrating tablet, Take 30 mg daily by mouth., Disp: , Rfl:  .  metroNIDAZOLE (FLAGYL) 500 MG tablet, Take 1 tablet (500 mg total) by mouth 2 (two) times daily., Disp: 14 tablet, Rfl: 0 .  olmesartan (BENICAR) 20 MG tablet, Take 20 mg by mouth daily., Disp: , Rfl:  .  ranitidine (ZANTAC) 150 MG tablet, Take 150 mg by mouth 2 (two) times daily., Disp: , Rfl:  .  spironolactone (ALDACTONE) 50 MG tablet, Take 1 tablet (50 mg total) by mouth daily., Disp: 90 tablet, Rfl: 0   Allergies  Allergen Reactions  . Amlodipine     Other reaction(s): OTHER  . Doxazosin     Other reaction(s): OTHER  . Lisinopril Other (See Comments)    unknown   Review of Systems   Pertinent items are noted in the HPI. Otherwise, a complete ROS is negative.  Vitals:   Vitals:   08/06/18 1524 08/06/18 1531  BP: (!) 150/88 138/84  Pulse: 96   Temp: 98.6 F (37 C)   TempSrc: Oral   SpO2: 98%   Weight: 192 lb (87.1 kg)  Height: 5' 2.5" (1.588 m)      Body mass index is 34.56 kg/m.  Physical Exam:   Physical Exam Vitals signs and nursing note reviewed.  HENT:     Head: Normocephalic and atraumatic.     Nose:     Right Sinus: Maxillary sinus tenderness present.     Left Sinus: Maxillary sinus tenderness present.  Eyes:     Pupils: Pupils are equal, round, and reactive to light.  Neck:     Musculoskeletal: Normal range of motion and neck supple.  Cardiovascular:     Rate and Rhythm: Normal rate and regular rhythm.     Heart sounds: Normal heart sounds.  Pulmonary:     Effort: Pulmonary effort is normal.  Abdominal:     Palpations: Abdomen is soft.  Musculoskeletal:        General: Swelling  present.  Skin:    General: Skin is warm.  Psychiatric:        Behavior: Behavior normal.    Results for orders placed or performed in visit on 08/06/18  Comp Met (CMET)  Result Value Ref Range   Sodium 138 135 - 145 mEq/L   Potassium 3.7 3.5 - 5.1 mEq/L   Chloride 103 96 - 112 mEq/L   CO2 29 19 - 32 mEq/L   Glucose, Bld 101 (H) 70 - 99 mg/dL   BUN 16 6 - 23 mg/dL   Creatinine, Ser 1.14 0.40 - 1.20 mg/dL   Total Bilirubin 0.2 0.2 - 1.2 mg/dL   Alkaline Phosphatase 60 39 - 117 U/L   AST 18 0 - 37 U/L   ALT 24 0 - 35 U/L   Total Protein 7.1 6.0 - 8.3 g/dL   Albumin 3.9 3.5 - 5.2 g/dL   Calcium 9.2 8.4 - 10.5 mg/dL   GFR 64.45 >60.00 mL/min    Assessment and Plan:   Carol [name] was seen today for follow-up.  Diagnoses and all orders for this visit:  Bilateral edema of lower extremity Comments: Edema after change from chlorthalidone to spironolactone. Discussed options. She prefers to focus on decreasing edema. Discussed potassium issues. Orders: -     hydrochlorothiazide (HYDRODIURIL) 25 MG tablet; Take 1 tablet (25 mg total) by mouth daily. -     potassium chloride (K-DUR) 10 MEQ tablet; Take 1 tablet (10 mEq total) by mouth 2 (two) times daily.  Subacute maxillary sinusitis -     amoxicillin (AMOXIL) 875 MG tablet; Take 1 tablet (875 mg total) by mouth 2 (two) times daily.  Essential hypertension -     Comp Met (CMET) -     hydrochlorothiazide (HYDRODIURIL) 25 MG tablet; Take 1 tablet (25 mg total) by mouth daily. -     potassium chloride (K-DUR) 10 MEQ tablet; Take 1 tablet (10 mEq total) by mouth 2 (two) times daily.  Chronic kidney disease, stage III (moderate) (HCC) -     Comp Met (CMET)  Hypokalemia -     Comp Met (CMET) -     hydrochlorothiazide (HYDRODIURIL) 25 MG tablet; Take 1 tablet (25 mg total) by mouth daily. -     potassium chloride (K-DUR) 10 MEQ tablet; Take 1 tablet (10 mEq total) by mouth 2 (two) times daily.   . Orders and follow up as documented in  [place], reviewed diet, exercise and weight control, cardiovascular risk and specific lipid/LDL goals reviewed, reviewed medications and side effects in detail.  . Reviewed expectations re: course of current medical issues. . Outlined signs  and symptoms indicating need for more acute intervention. . Patient verbalized understanding and all questions were answered. . Patient received an After Visit Summary.  Briscoe Deutscher, DO [place] 08/09/2018

## 2018-08-07 MED ORDER — POTASSIUM CHLORIDE ER 10 MEQ PO TBCR: 10.0000 meq | EXTENDED_RELEASE_TABLET | Freq: Two times a day (BID) | ORAL | 2 refills | Status: DC

## 2018-08-07 MED ORDER — HYDROCHLOROTHIAZIDE 25 MG PO TABS: 25.0000 mg | ORAL_TABLET | Freq: Every day | ORAL | 3 refills | Status: DC

## 2018-08-09 ENCOUNTER — Encounter: Payer: Self-pay | Admitting: Family Medicine

## 2018-08-20 ENCOUNTER — Telehealth: Payer: Self-pay | Admitting: *Deleted

## 2018-08-20 ENCOUNTER — Other Ambulatory Visit: Payer: BLUE CROSS/BLUE SHIELD

## 2018-08-20 MED ORDER — METRONIDAZOLE 0.75 % VA GEL: 1.0000 | Freq: Every day | VAGINAL | 0 refills | Status: AC

## 2018-08-20 NOTE — Telephone Encounter (Signed)
Spoke with patient. Treated for BV on 11/18 with Flagyl PO, symptoms resolved. Reports clear vaginal d/c with odor started last week, patient states she was advised to call if symptoms return. Patient request to try metrogel this time.   Denies vaginal bleeding, pain or discomfort.   Confirmed pharmacy on file. Advised will review with Dr. Sabra Heck and return call with recommendations. Patient agreeable.   Dr. Sabra Heck -please advise.

## 2018-08-20 NOTE — Telephone Encounter (Signed)
Spoke with patient, advised as seen below pe Dr. Sabra Heck. Rx for metrogel to verified pharmacy. Patient verbalizes understanding and is agreeable.   Encounter closed.

## 2018-08-20 NOTE — Telephone Encounter (Signed)
OK to treat with metrogel 0.75% nightly x 5 nights.  If symptoms return, she will need recheck at that time.  Thanks.

## 2018-08-20 NOTE — Telephone Encounter (Signed)
Patient says she think her bv symptoms have come back and was told to call if that happened.

## 2018-08-23 ENCOUNTER — Other Ambulatory Visit: Payer: BLUE CROSS/BLUE SHIELD

## 2018-08-23 ENCOUNTER — Ambulatory Visit: Payer: BLUE CROSS/BLUE SHIELD

## 2018-08-28 ENCOUNTER — Other Ambulatory Visit: Payer: BLUE CROSS/BLUE SHIELD

## 2018-08-28 ENCOUNTER — Ambulatory Visit: Payer: BLUE CROSS/BLUE SHIELD

## 2018-09-04 ENCOUNTER — Other Ambulatory Visit (INDEPENDENT_AMBULATORY_CARE_PROVIDER_SITE_OTHER): Payer: BLUE CROSS/BLUE SHIELD

## 2018-09-04 ENCOUNTER — Ambulatory Visit: Payer: BLUE CROSS/BLUE SHIELD

## 2018-09-04 VITALS — BP 124/84 | Ht 62.5 in | Wt 189.0 lb

## 2018-09-04 DIAGNOSIS — I10 Essential (primary) hypertension: Secondary | ICD-10-CM

## 2018-09-04 DIAGNOSIS — E876 Hypokalemia: Secondary | ICD-10-CM | POA: Diagnosis not present

## 2018-09-04 LAB — COMPREHENSIVE METABOLIC PANEL
ALT: 21 U/L (ref 0–35)
AST: 15 U/L (ref 0–37)
Albumin: 3.9 g/dL (ref 3.5–5.2)
Alkaline Phosphatase: 68 U/L (ref 39–117)
BUN: 16 mg/dL (ref 6–23)
CO2: 35 mEq/L — ABNORMAL HIGH (ref 19–32)
Calcium: 9.9 mg/dL (ref 8.4–10.5)
Chloride: 98 mEq/L (ref 96–112)
Creatinine, Ser: 1.06 mg/dL (ref 0.40–1.20)
GFR: 70.08 mL/min (ref >60.00)
Glucose, Bld: 108 mg/dL — ABNORMAL HIGH (ref 70–99)
Potassium: 3.5 mEq/L (ref 3.5–5.1)
Sodium: 138 mEq/L (ref 135–145)
Total Bilirubin: 0.2 mg/dL (ref 0.2–1.2)
Total Protein: 7 g/dL (ref 6.0–8.3)

## 2018-09-04 LAB — MAGNESIUM: Magnesium: 1.9 mg/dL (ref 1.5–2.5)

## 2018-09-04 NOTE — Progress Notes (Signed)
Pt here for weight and blood pressure check.  Current weight today was 189 lbs and patient states she is no longer taking the spironolactone; weight is down 3 lbs since last visit.  Pt denies chest pain, dizziness, headaches.  Bp today was 124/84.  Pt states that she is also here for lab work also.

## 2018-10-31 ENCOUNTER — Encounter: Payer: Self-pay | Admitting: Physician Assistant

## 2018-10-31 ENCOUNTER — Ambulatory Visit: Payer: BLUE CROSS/BLUE SHIELD | Admitting: Physician Assistant

## 2018-10-31 VITALS — BP 136/80 | HR 125 | Temp 101.1°F | Ht 62.5 in | Wt 184.2 lb

## 2018-10-31 DIAGNOSIS — R05 Cough: Secondary | ICD-10-CM | POA: Diagnosis not present

## 2018-10-31 DIAGNOSIS — R059 Cough, unspecified: Secondary | ICD-10-CM

## 2018-10-31 LAB — POC INFLUENZA A&B (BINAX/QUICKVUE)
Influenza A, POC: NEGATIVE
Influenza B, POC: NEGATIVE

## 2018-10-31 MED ORDER — AMOXICILLIN-POT CLAVULANATE 875-125 MG PO TABS: 1.0000 | ORAL_TABLET | Freq: Two times a day (BID) | ORAL | 0 refills | Status: DC

## 2018-10-31 NOTE — Progress Notes (Signed)
Carol [name] is a 53 y.o. female here for a new problem.  I acted as a Education administrator for [employer], PA-C Anselmo Pickler, LPN  History of Present Illness:   Chief Complaint  Patient presents with  . Generalized Body Aches    x2 weeks  . Body chills  . Cough    x2 weeks    Cough  This is a new problem. Episode onset: Started 2 weeks ago. The problem has been gradually worsening. The cough is productive of sputum (expectorating clear/green sputum last week). Associated symptoms include chills, ear pain (right ear), headaches, nasal congestion and postnasal drip. Pertinent negatives include no fever, sore throat or shortness of breath. Associated symptoms comments: Body aches. Treatments tried: Zyrtec, Alka-seltzer cold. The treatment provided mild relief. Her past medical history is significant for bronchitis. There is no history of asthma.   She has had symptoms x 2 weeks but had sudden worsening of symptoms on Monday.   Past Medical History:  Diagnosis Date  . Allergy   . Anemia   . Arthritis   . Chicken pox   . GERD (gastroesophageal reflux disease)   . Hay fever   . High cholesterol   . Hypertension   . Kidney disease 2011     Social History   Socioeconomic History  . Marital status: Married    Spouse name: Not on file  . Number of children: Not on file  . Years of education: Not on file  . Highest education level: Not on file  Occupational History  . Not on file  Social Needs  . Financial resource strain: Not on file  . Food insecurity:    Worry: Not on file    Inability: Not on file  . Transportation needs:    Medical: Not on file    Non-medical: Not on file  Tobacco Use  . Smoking status: Never Smoker  . Smokeless tobacco: Never Used  Substance and Sexual Activity  . Alcohol use: Yes    Comment: occasionally  . Drug use: No  . Sexual activity: Yes    Partners: Male    Birth control/protection: Surgical  Lifestyle  . Physical activity:    Days  per week: Not on file    Minutes per session: Not on file  . Stress: Not on file  Relationships  . Social connections:    Talks on phone: Not on file    Gets together: Not on file    Attends religious service: Not on file    Active member of club or organization: Not on file    Attends meetings of clubs or organizations: Not on file    Relationship status: Not on file  . Intimate partner violence:    Fear of current or ex partner: Not on file    Emotionally abused: Not on file    Physically abused: Not on file    Forced sexual activity: Not on file  Other Topics Concern  . Not on file  Social History Narrative  . Not on file    Past Surgical History:  Procedure Laterality Date  . ABDOMINAL HYSTERECTOMY  2007  . CESAREAN SECTION      Family History  Problem Relation Age of Onset  . Hypertension Mother   . High Cholesterol Mother   . Kidney disease Mother   . Colon polyps Mother   . Hypertension Father   . Hypertension Sister   . High Cholesterol Brother   . Kidney disease  Brother   . Hypertension Maternal Grandmother   . Heart attack Maternal Grandfather   . Depression Paternal Grandfather   . Kidney disease Paternal Grandfather   . Colon cancer Neg Hx   . Esophageal cancer Neg Hx   . Rectal cancer Neg Hx   . Stomach cancer Neg Hx     Allergies  Allergen Reactions  . Amlodipine     Other reaction(s): OTHER  . Doxazosin     Other reaction(s): OTHER  . Lisinopril Other (See Comments)    unknown    Current Medications:   Current Outpatient Medications:  .  cholecalciferol (VITAMIN D) 1000 units tablet, Take 1,000 Units by mouth daily., Disp: , Rfl:  .  Dulaglutide (TRULICITY) 1.5 KX/3.8HW SOPN, Inject 1.5 mg into the skin once a week., Disp: 4 pen, Rfl: 6 .  fexofenadine (ALLEGRA ODT) 30 MG disintegrating tablet, Take 30 mg daily by mouth., Disp: , Rfl:  .  hydrochlorothiazide (HYDRODIURIL) 25 MG tablet, Take 1 tablet (25 mg total) by mouth daily., Disp: 90  tablet, Rfl: 3 .  olmesartan (BENICAR) 20 MG tablet, Take 20 mg by mouth daily., Disp: , Rfl:  .  potassium chloride (K-DUR) 10 MEQ tablet, Take 1 tablet (10 mEq total) by mouth 2 (two) times daily., Disp: 60 tablet, Rfl: 2 .  ranitidine (ZANTAC) 150 MG tablet, Take 150 mg by mouth 2 (two) times daily., Disp: , Rfl:  .  amoxicillin-clavulanate (AUGMENTIN) 875-125 MG tablet, Take 1 tablet by mouth 2 (two) times daily., Disp: 20 tablet, Rfl: 0 .  spironolactone (ALDACTONE) 50 MG tablet, Take 1 tablet (50 mg total) by mouth daily. (Patient not taking: Reported on 10/31/2018), Disp: 90 tablet, Rfl: 0   Review of Systems:   Review of Systems  Constitutional: Positive for chills. Negative for fever.  HENT: Positive for ear pain (right ear) and postnasal drip. Negative for sore throat.   Respiratory: Positive for cough. Negative for shortness of breath.   Neurological: Positive for headaches.    Vitals:   Vitals:   10/31/18 1311  BP: 136/80  Pulse: (!) 125  Temp: (!) 101.1 F (38.4 C)  TempSrc: Oral  SpO2: 97%  Weight: 184 lb 4 oz (83.6 kg)  Height: 5' 2.5" (1.588 m)     Body mass index is 33.16 kg/m.  Physical Exam:   Physical Exam Vitals signs and nursing note reviewed.  Constitutional:      General: She is not in acute distress.    Appearance: She is well-developed. She is not ill-appearing or toxic-appearing.  HENT:     Head: Normocephalic and atraumatic.     Right Ear: Tympanic membrane, ear canal and external ear normal. Tympanic membrane is not erythematous, retracted or bulging.     Left Ear: Tympanic membrane, ear canal and external ear normal. Tympanic membrane is not erythematous, retracted or bulging.     Nose: Mucosal edema, congestion and rhinorrhea present.     Right Sinus: Frontal sinus tenderness present. No maxillary sinus tenderness.     Left Sinus: Frontal sinus tenderness present. No maxillary sinus tenderness.     Mouth/Throat:     Pharynx: Uvula midline.  No posterior oropharyngeal erythema.  Eyes:     General: Lids are normal.     Conjunctiva/sclera: Conjunctivae normal.  Neck:     Trachea: Trachea normal.  Cardiovascular:     Rate and Rhythm: Regular rhythm. Tachycardia present.     Heart sounds: Normal heart sounds, S1 normal and  S2 normal.  Pulmonary:     Effort: Pulmonary effort is normal.     Breath sounds: Normal breath sounds. No decreased breath sounds, wheezing, rhonchi or rales.  Lymphadenopathy:     Cervical: No cervical adenopathy.  Skin:    General: Skin is warm and dry.  Neurological:     Mental Status: She is alert.  Psychiatric:        Speech: Speech normal.        Behavior: Behavior normal. Behavior is cooperative.    Results for orders placed or performed in visit on 10/31/18  POC Influenza A&B(BINAX/QUICKVUE)  Result Value Ref Range   Influenza A, POC Negative Negative   Influenza B, POC Negative Negative     Assessment and Plan:   Darly was seen today for generalized body aches, body chills and cough.  Diagnoses and all orders for this visit:  Cough -     POC Influenza A&B(BINAX/QUICKVUE)  Other orders -     amoxicillin-clavulanate (AUGMENTIN) 875-125 MG tablet; Take 1 tablet by mouth 2 (two) times daily.   No red flags on exam. Flu test negative. Given duration of symptoms, will treat sinusitis with augmentin. Discussed taking medications as prescribed. Reviewed return precautions including worsening fever, SOB, worsening cough or other concerns. Push fluids and rest. I recommend that patient follow-up if symptoms worsen or persist despite treatment x 7-10 days, sooner if needed.  . Reviewed expectations re: course of current medical issues. . Discussed self-management of symptoms. . Outlined signs and symptoms indicating need for more acute intervention. . Patient verbalized understanding and all questions were answered. . See orders for this visit as documented in the electronic medical  record. . Patient received an After-Visit Summary.  CMA or LPN served as scribe during this visit. History, Physical, and Plan performed by medical provider. The above documentation has been reviewed and is accurate and complete.   Inda Coke, PA-C

## 2018-10-31 NOTE — Patient Instructions (Signed)
It was great to see you!  Use medication as prescribed: augmentin  Push fluids and get plenty of rest. Please return if you are not improving as expected, or if you have high fevers (>101.5) or difficulty swallowing or worsening productive cough.  Call clinic with questions.  I hope you start feeling better soon!

## 2018-11-05 ENCOUNTER — Ambulatory Visit: Payer: Self-pay | Admitting: *Deleted

## 2018-11-05 ENCOUNTER — Telehealth: Payer: Self-pay

## 2018-11-05 ENCOUNTER — Telehealth: Payer: Self-pay | Admitting: Family Medicine

## 2018-11-05 ENCOUNTER — Other Ambulatory Visit: Payer: Self-pay | Admitting: Physician Assistant

## 2018-11-05 MED ORDER — AZITHROMYCIN 250 MG PO TABS: ORAL_TABLET | ORAL | 0 refills | Status: DC

## 2018-11-05 NOTE — Telephone Encounter (Signed)
See note

## 2018-11-05 NOTE — Telephone Encounter (Signed)
Please see message and advise 

## 2018-11-05 NOTE — Telephone Encounter (Signed)
Pt called back to discuss symptoms; see nurse triage dated 11/05/2018.

## 2018-11-05 NOTE — Telephone Encounter (Signed)
Augmentin, a type of amoxicillin, may cause diarrhea. Let's stop it since its bothersome.  Start azithromycin. I have sent this in. May start tomorrow.  Push fluids and eat bland diet.

## 2018-11-05 NOTE — Telephone Encounter (Signed)
See telephone encounter dated 11/05/2018; pt states that she started having diarrhea on 11/01/2018 and stomach pain, AND nausea on 11/02/2018; she has been having intermittently had episodes of (6) of watery diarrhea since starting amoxicillin- clavulanate 875-125 on 10/31/2018; the pt took her last dose on 11/04/2018 at 2000; the pt previously had taken amoxicilin and did not have these symptoms; nurse triage inititiated and recommendations made per  protocol; the pt verbalizes understanding; she would like to have something else called in to [facility], [place], [place]; the pt can be contacted at [phone], and a message can be left; pt normally sees Dr Briscoe Deutscher, [facility];pt last seen by Inda Coke 10/31/2018 will route to office for final disposition.   Reason for Disposition . Caller has URGENT medication question about med that PCP prescribed and triager unable to answer question  Answer Assessment - Initial Assessment Questions 1. SYMPTOMS: "Do you have any symptoms?"     Diarrhea; stomachache/indigestion   2. SEVERITY: If symptoms are present, ask "Are they mild, moderate or severe?"     moderate  Answer Assessment - Initial Assessment Questions 1. MAIN SYMPTOM: "What is your main symptom?" "How bad is it?"       Diarrhea, stomach ache,  2. RESPIRATORY STATUS: "Are you having difficulty breathing?"  (e.g., yes/no, wheezing, unable to complete a sentence)      No difficulty breathing 3. SWALLOWING: "Can you swallow?" (e.g., yes/no, food, fluid, saliva)      No difficulty swallowing 4. VASCULAR STATUS: "Are you feeling weak?" If so, ask: "Can you stand and walk normally?"     Not feeling weak 5. ONSET: "When did the reaction start?" (Minutes or hours ago)      11/01/2018 6. SUBSTANCE: "What are you reacting to?" "When did the contact occur?"      Symptoms started after taking augmentin on 10/31/2018 7. PREVIOUS REACTION: "Have you ever reacted to it before?" If so, ask:  "What happened that time?"     no 8. EPINEPHRINE: "Do you have an epinephrine autoinjector (e.g., EpiPen)?"     no  Protocols used: MEDICATION QUESTION CALL-A-AH, ANAPHYLAXIS-A-AH

## 2018-11-05 NOTE — Telephone Encounter (Signed)
Copied from [place] [id number]. Topic: Quick Communication - See Telephone Encounter >> Nov 05, 2018  7:27 AM Ivar Drape wrote: CRM for notification. See Telephone encounter for: 11/05/18. Patient was in last week and was prescribed antibiotics.  She is having reactions to this med. She has been experiencing diarrhea and stomach pain.  She would like to be prescribed something else, and have it sent to her preferred [facility] in [place].  Please advise.

## 2018-11-05 NOTE — Telephone Encounter (Signed)
Spoke to pt told her Carol [name] has sent another antibiotic in for you Azithromycin which you can start tomorrow. Pt verbalized understanding. Told her Augmentin, a type of amoxicillin, may cause diarrhea. Let's stop it since its bothersome. Push fluids and eat bland diet. Pt verbalized understanding.

## 2018-11-05 NOTE — Telephone Encounter (Signed)
Carol [name] (Key: [id number])  Trulicity 1.5MG /0.5ML pen-injectors  Form [employer] fund[employer] PA Form (NCPDP)  Created  3 days ago  Sent to Plan  less than a minute ago  Determination    Wait for Questions [employer]NCP[employer] typically responds with questions in less than 15 minutes, but may take up to 24 hours.

## 2018-11-07 ENCOUNTER — Other Ambulatory Visit: Payer: Self-pay | Admitting: Family Medicine

## 2018-11-07 DIAGNOSIS — R600 Localized edema: Secondary | ICD-10-CM

## 2018-11-07 DIAGNOSIS — E876 Hypokalemia: Secondary | ICD-10-CM

## 2018-11-07 DIAGNOSIS — I10 Essential (primary) hypertension: Secondary | ICD-10-CM

## 2018-11-21 DIAGNOSIS — L219 Seborrheic dermatitis, unspecified: Secondary | ICD-10-CM | POA: Diagnosis not present

## 2018-12-28 DIAGNOSIS — I10 Essential (primary) hypertension: Secondary | ICD-10-CM | POA: Diagnosis not present

## 2018-12-28 DIAGNOSIS — N182 Chronic kidney disease, stage 2 (mild): Secondary | ICD-10-CM | POA: Diagnosis not present

## 2019-01-10 ENCOUNTER — Other Ambulatory Visit: Payer: Self-pay | Admitting: Family Medicine

## 2019-01-10 DIAGNOSIS — R600 Localized edema: Secondary | ICD-10-CM

## 2019-01-10 DIAGNOSIS — I10 Essential (primary) hypertension: Secondary | ICD-10-CM

## 2019-01-10 DIAGNOSIS — E876 Hypokalemia: Secondary | ICD-10-CM

## 2019-01-11 NOTE — Telephone Encounter (Signed)
Last OV 08/06/2018 Last refill 11/08/18 #60/1 Next OV not scheduled

## 2019-01-24 DIAGNOSIS — L219 Seborrheic dermatitis, unspecified: Secondary | ICD-10-CM | POA: Diagnosis not present

## 2019-01-24 DIAGNOSIS — L700 Acne vulgaris: Secondary | ICD-10-CM | POA: Diagnosis not present

## 2019-01-26 IMAGING — MG DIGITAL SCREENING BILATERAL MAMMOGRAM WITH TOMO AND CAD
8 series · 8 of 24 positions shown · non-contrast
Comparison: Previous exam(s).

CLINICAL DATA: Screening.

EXAM:
DIGITAL SCREENING BILATERAL MAMMOGRAM WITH TOMO AND CAD

[L MLO synth-2D]
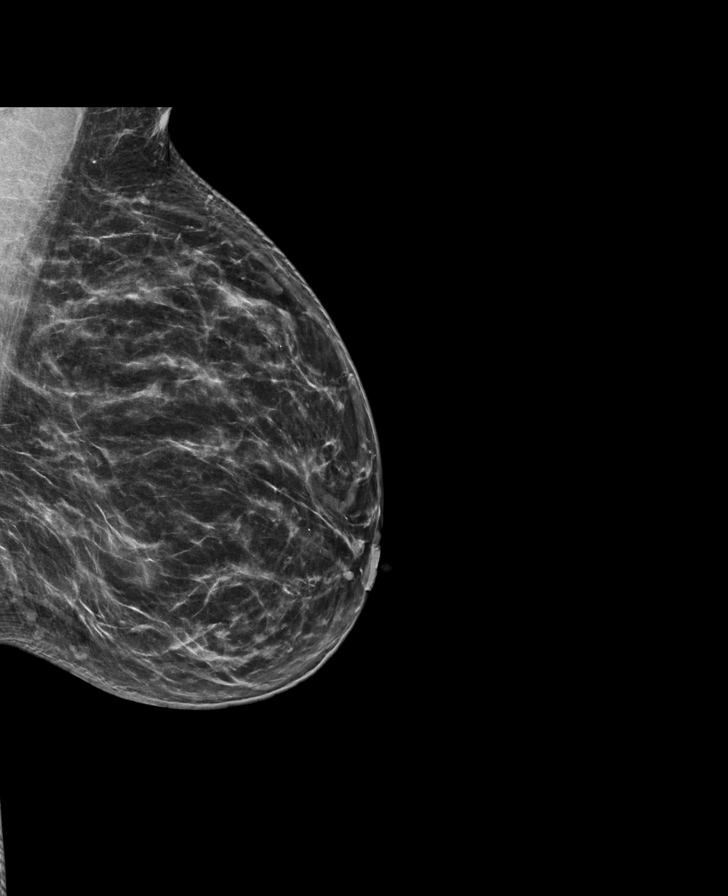

[R CC synth-2D]
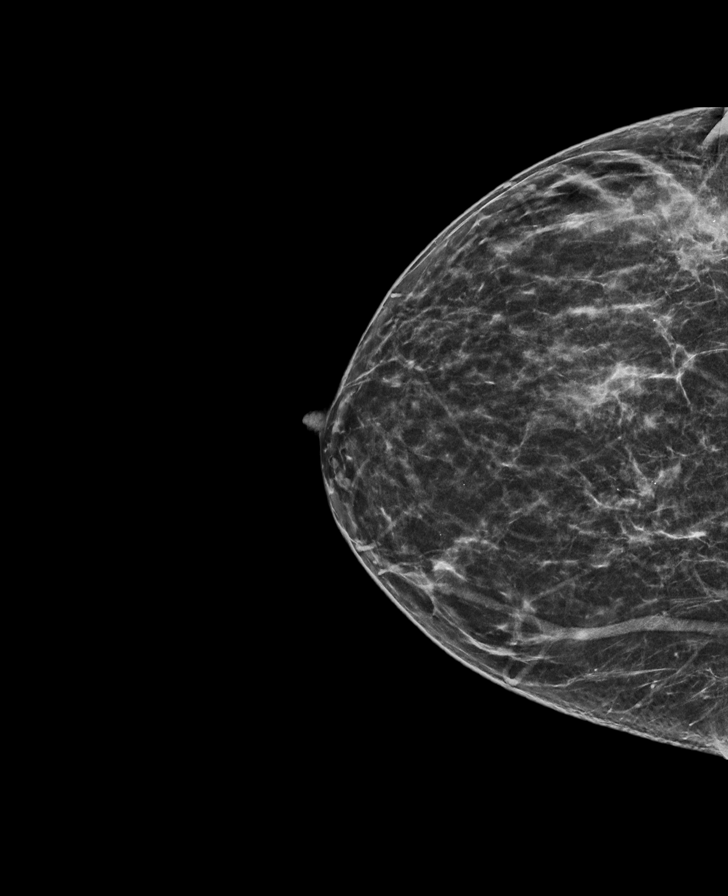

[R MLO synth-2D]
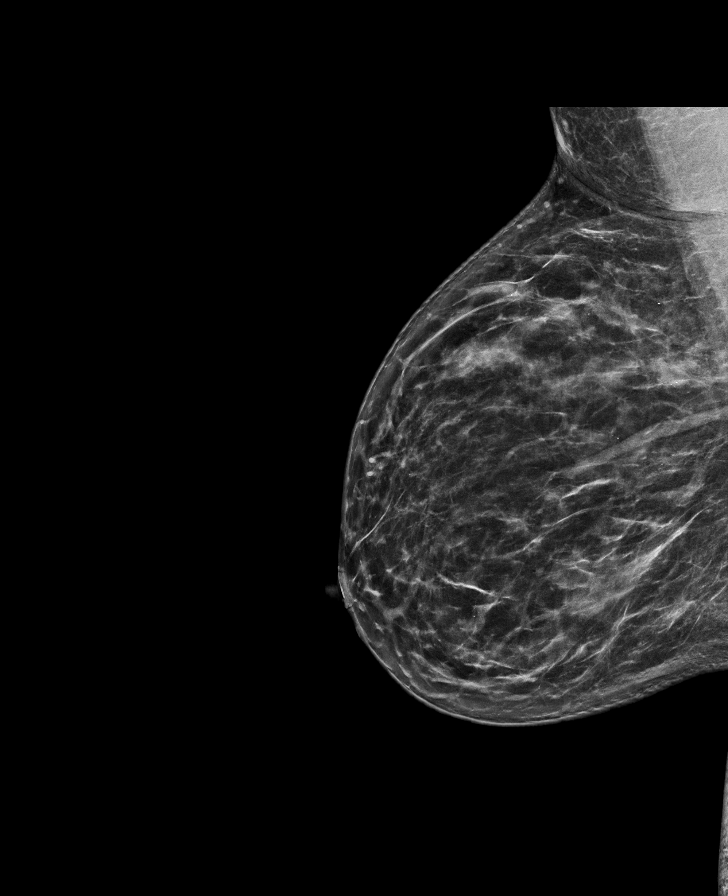

[L CC synth-2D]
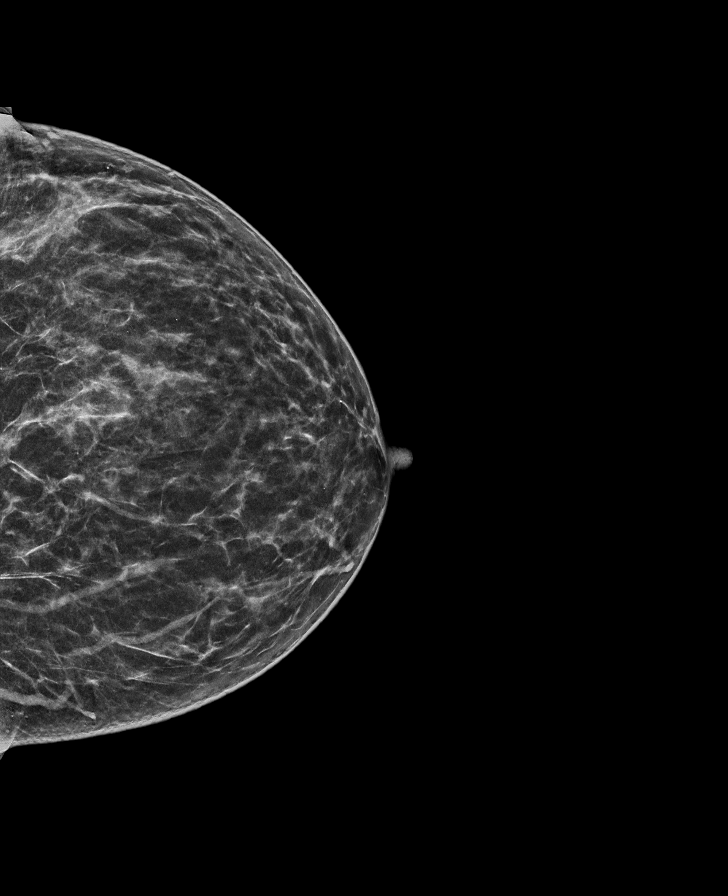

[R MLO tomo · tomo slice 35/70.0]
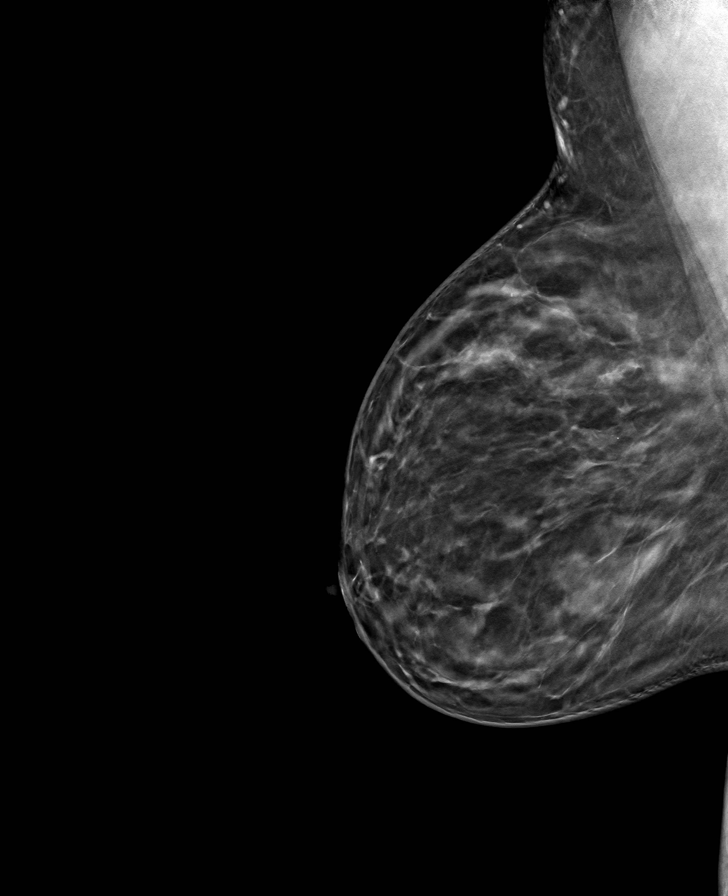

[R CC tomo · tomo slice 29/57.0]
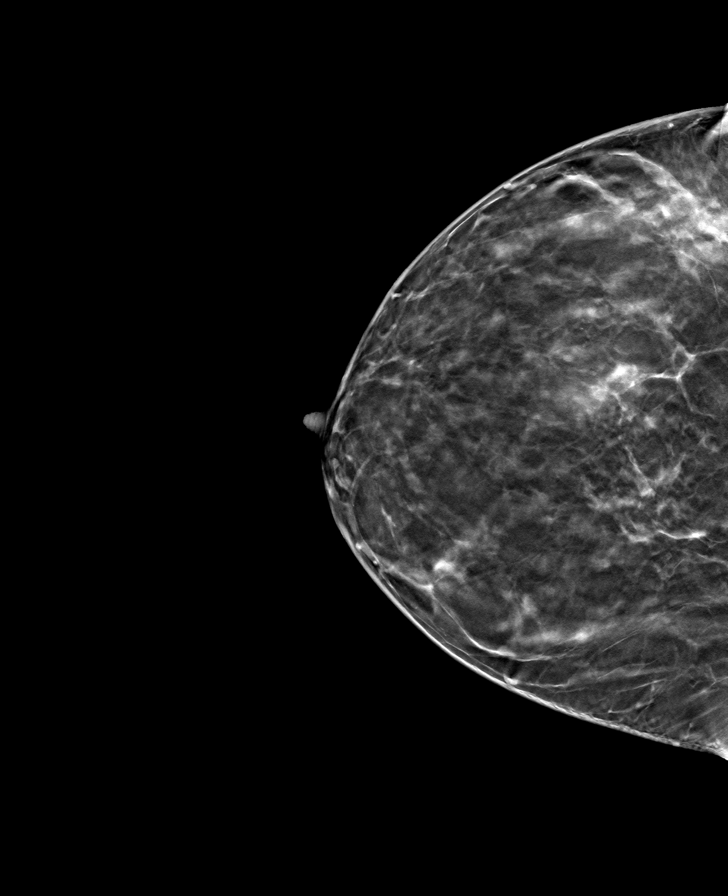

[L CC tomo · tomo slice 30/59.0]
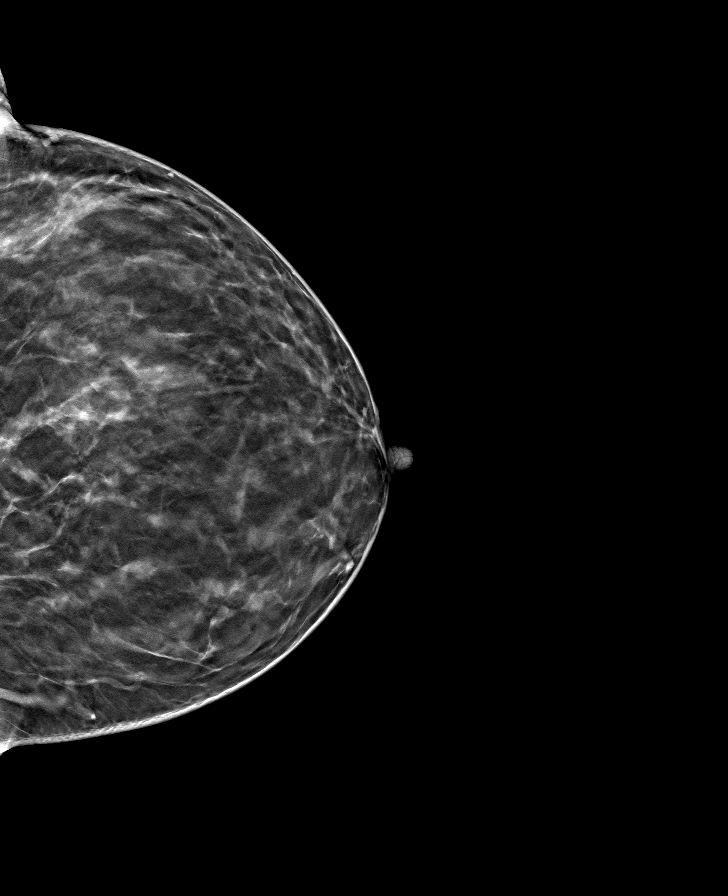

[L MLO tomo · tomo slice 37/72.0]
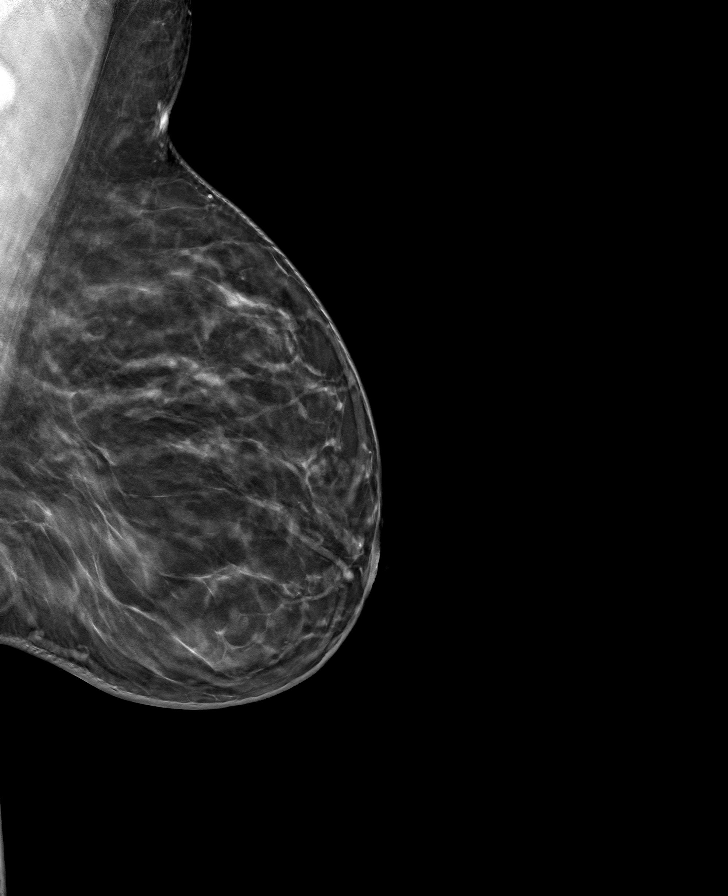

[8 of 24 positions shown; findings below may reference images not displayed]

ACR Breast Density Category c: The breast tissue is heterogeneously
dense, which may obscure small masses.
FINDINGS: There are no findings suspicious for malignancy. Images were
processed with CAD.
IMPRESSION: No mammographic evidence of malignancy. A result letter of this
screening mammogram will be mailed directly to the patient.

RECOMMENDATION:
Screening mammogram in one year. (Code:[id number]-[id number])

BI-RADS CATEGORY  1: Negative.

## 2019-02-07 ENCOUNTER — Other Ambulatory Visit: Payer: Self-pay | Admitting: Family Medicine

## 2019-02-07 DIAGNOSIS — I10 Essential (primary) hypertension: Secondary | ICD-10-CM

## 2019-02-07 DIAGNOSIS — R600 Localized edema: Secondary | ICD-10-CM

## 2019-02-07 DIAGNOSIS — E876 Hypokalemia: Secondary | ICD-10-CM

## 2019-02-21 ENCOUNTER — Other Ambulatory Visit: Payer: Self-pay | Admitting: Family Medicine

## 2019-02-21 DIAGNOSIS — E8881 Metabolic syndrome: Secondary | ICD-10-CM

## 2019-02-27 ENCOUNTER — Ambulatory Visit (INDEPENDENT_AMBULATORY_CARE_PROVIDER_SITE_OTHER): Payer: BC Managed Care – PPO | Admitting: Family Medicine

## 2019-02-27 ENCOUNTER — Other Ambulatory Visit: Payer: Self-pay

## 2019-02-27 ENCOUNTER — Encounter: Payer: Self-pay | Admitting: Family Medicine

## 2019-02-27 VITALS — Ht 62.5 in | Wt 187.0 lb

## 2019-02-27 DIAGNOSIS — N183 Chronic kidney disease, stage 3 unspecified: Secondary | ICD-10-CM

## 2019-02-27 DIAGNOSIS — I10 Essential (primary) hypertension: Secondary | ICD-10-CM

## 2019-02-27 DIAGNOSIS — E8881 Metabolic syndrome: Secondary | ICD-10-CM

## 2019-02-27 MED ORDER — TRULICITY 1.5 MG/0.5ML ~~LOC~~ SOAJ: SUBCUTANEOUS | 4 refills | Status: DC

## 2019-02-27 MED ORDER — OLMESARTAN MEDOXOMIL 20 MG PO TABS: 20.0000 mg | ORAL_TABLET | Freq: Every day | ORAL | 3 refills | Status: DC

## 2019-02-27 MED ORDER — HYDROCHLOROTHIAZIDE 25 MG PO TABS: 25.0000 mg | ORAL_TABLET | Freq: Every day | ORAL | 3 refills | Status: DC

## 2019-02-27 NOTE — Progress Notes (Signed)
Virtual Visit via Video   Due to the COVID-19 pandemic, this visit was completed with telemedicine (audio/video) technology to reduce patient and provider exposure as well as to preserve personal protective equipment.   I connected with Carol [name] by a video enabled telemedicine application and verified that I am speaking with the correct person using two identifiers. Location patient: Home Location provider: [facility], Office Persons participating in the virtual visit: Scottlyn Tonnya Garbett, Briscoe Deutscher, DO   I discussed the limitations of evaluation and management by telemedicine and the availability of in person appointments. The patient expressed understanding and agreed to proceed.  Care Team   Patient Care Team: Briscoe Deutscher, DO as PCP - General (Family Medicine) Hegde, Lyndee Leo, MD as Referring Physician (Nephrology) Megan Salon, MD as Consulting Physician (Gynecology)  Subjective:   HPI: Tolerating medications without side effects. Weight gain of 3 pounds during COVID. Walking each morning. Will come in 2-4 weeks for CPE with fasting labs. Will work on adding one healthy habit before that time.   Review of Systems  Constitutional: Negative for chills, fever, malaise/fatigue and weight loss.  Respiratory: Negative for cough, shortness of breath and wheezing.   Cardiovascular: Negative for chest pain, palpitations and leg swelling.  Gastrointestinal: Negative for abdominal pain, constipation, diarrhea, nausea and vomiting.  Genitourinary: Negative for dysuria and urgency.  Musculoskeletal: Negative for joint pain and myalgias.  Skin: Negative for rash.  Neurological: Negative for dizziness and headaches.  Psychiatric/Behavioral: Negative for depression, substance abuse and suicidal ideas. The patient is not nervous/anxious.     Patient Active Problem List   Diagnosis Date Noted  . Insulin resistance 02/27/2019  . Hypokalemia 07/18/2018  . Menopausal  vaginal dryness 07/18/2018  . Essential hypertension 07/18/2018  . Weight gain 07/18/2018  . Mixed hyperlipidemia 04/13/2018  . Morbid obesity ([place]) 01/05/2018  . Prediabetes 10/02/2017  . Dyspepsia 12/07/2012  . Chronic kidney disease, stage III (moderate) (HCC) 12/03/2010    Social History   Tobacco Use  . Smoking status: Never Smoker  . Smokeless tobacco: Never Used  Substance Use Topics  . Alcohol use: Yes    Comment: occasionally   Current Outpatient Medications:  .  cholecalciferol (VITAMIN D) 1000 units tablet, Take 1,000 Units by mouth daily., Disp: , Rfl:  .  Dulaglutide (TRULICITY) 1.5 JS/2.8BT SOPN, Inject 1.5 mg into the skin once a week, Disp: 4 pen, Rfl: 4 .  fexofenadine (ALLEGRA ODT) 30 MG disintegrating tablet, Take 30 mg daily by mouth., Disp: , Rfl:  .  hydrochlorothiazide (HYDRODIURIL) 25 MG tablet, Take 1 tablet (25 mg total) by mouth daily., Disp: 90 tablet, Rfl: 3 .  KLOR-CON 10 10 MEQ tablet, take 1 tablet by mouth twice a day, Disp: 60 tablet, Rfl: 0 .  olmesartan (BENICAR) 20 MG tablet, Take 1 tablet (20 mg total) by mouth daily., Disp: 90 tablet, Rfl: 3 .  psyllium (REGULOID) 0.52 g capsule, Take 0.52 g by mouth daily., Disp: , Rfl:   Allergies  Allergen Reactions  . Amlodipine     Other reaction(s): OTHER  . Doxazosin     Other reaction(s): OTHER  . Lisinopril Other (See Comments)    unknown  . Spironolactone Other (See Comments)    Edema    Objective:   VITALS: Per patient if applicable, see vitals. GENERAL: Alert, appears well and in no acute distress. HEENT: Atraumatic, conjunctiva clear, no obvious abnormalities on inspection of external nose and ears. NECK: Normal movements  of the head and neck. CARDIOPULMONARY: No increased WOB. Speaking in clear sentences. I:E ratio WNL.  MS: Moves all visible extremities without noticeable abnormality. PSYCH: Pleasant and cooperative, well-groomed. Speech normal rate and rhythm. Affect is appropriate.  Insight and judgement are appropriate. Attention is focused, linear, and appropriate.  NEURO: CN grossly intact. Oriented as arrived to appointment on time with no prompting. Moves both UE equally.  SKIN: No obvious lesions, wounds, erythema, or cyanosis noted on face or hands.  Depression screen [facility] 2/9 02/27/2019 04/13/2018 08/04/2017  Decreased Interest 0 0 0  Down, Depressed, Hopeless 0 0 0  PHQ - 2 Score 0 0 0  Altered sleeping 0 - -  Tired, decreased energy 0 - -  Change in appetite 1 - -  Feeling bad or failure about yourself  0 - -  Trouble concentrating 0 - -  Moving slowly or fidgety/restless 0 - -  Suicidal thoughts 0 - -  PHQ-9 Score 1 - -  Difficult doing work/chores Not difficult at all - -    Assessment and Plan:   Carol [name] was seen today for medication refill.  Diagnoses and all orders for this visit:  Insulin resistance -     Dulaglutide (TRULICITY) 1.5 OZ/2.2QM SOPN; Inject 1.5 mg into the skin once a week  Morbid obesity (HCC)  Essential hypertension -     hydrochlorothiazide (HYDRODIURIL) 25 MG tablet; Take 1 tablet (25 mg total) by mouth daily. -     olmesartan (BENICAR) 20 MG tablet; Take 1 tablet (20 mg total) by mouth daily.  Chronic kidney disease, stage III (moderate) (HCC)   . COVID-19 Education: The signs and symptoms of COVID-19 were discussed with the patient and how to seek care for testing if needed. The importance of social distancing was discussed today. . Reviewed expectations re: course of current medical issues. . Discussed self-management of symptoms. . Outlined signs and symptoms indicating need for more acute intervention. . Patient verbalized understanding and all questions were answered. Marland Kitchen Health Maintenance issues including appropriate healthy diet, exercise, and smoking avoidance were discussed with patient. . See orders for this visit as documented in the electronic medical record.  Briscoe Deutscher, DO  Records requested if needed. Time  spent: 15 minutes, of which >50% was spent in obtaining information about her symptoms, reviewing her previous labs, evaluations, and treatments, counseling her about her condition (please see the discussed topics above), and developing a plan to further investigate it; she had a number of questions which I addressed.

## 2019-03-09 DIAGNOSIS — E786 Lipoprotein deficiency: Secondary | ICD-10-CM | POA: Diagnosis not present

## 2019-03-11 ENCOUNTER — Encounter: Payer: Self-pay | Admitting: Family Medicine

## 2019-03-11 ENCOUNTER — Other Ambulatory Visit: Payer: Self-pay | Admitting: Family Medicine

## 2019-03-11 ENCOUNTER — Ambulatory Visit (INDEPENDENT_AMBULATORY_CARE_PROVIDER_SITE_OTHER): Payer: BC Managed Care – PPO | Admitting: Family Medicine

## 2019-03-11 ENCOUNTER — Other Ambulatory Visit: Payer: Self-pay

## 2019-03-11 ENCOUNTER — Ambulatory Visit: Payer: BC Managed Care – PPO | Admitting: Family Medicine

## 2019-03-11 VITALS — BP 126/82 | HR 98 | Temp 98.6°F | Ht 62.5 in | Wt 186.0 lb

## 2019-03-11 DIAGNOSIS — R600 Localized edema: Secondary | ICD-10-CM

## 2019-03-11 DIAGNOSIS — N183 Chronic kidney disease, stage 3 unspecified: Secondary | ICD-10-CM

## 2019-03-11 DIAGNOSIS — I10 Essential (primary) hypertension: Secondary | ICD-10-CM | POA: Diagnosis not present

## 2019-03-11 DIAGNOSIS — E876 Hypokalemia: Secondary | ICD-10-CM

## 2019-03-11 DIAGNOSIS — M62838 Other muscle spasm: Secondary | ICD-10-CM

## 2019-03-11 MED ORDER — MAGNESIUM 400 MG PO TABS: 400.0000 mg | ORAL_TABLET | Freq: Every day | ORAL | 0 refills | Status: DC

## 2019-03-11 MED ORDER — BACLOFEN 20 MG PO TABS: 20.0000 mg | ORAL_TABLET | Freq: Three times a day (TID) | ORAL | 0 refills | Status: DC

## 2019-03-11 NOTE — Progress Notes (Signed)
Carol [name] is a 53 y.o. female here for an acute visit.  History of Present Illness:   Carol [name], CMA acting as scribe for Dr. Briscoe Deutscher.   HPI: Patient has had cramping in left calf off an on for two weeks. Starts mostly at night. She is able to walk and get cramp out. Sometimes she will have several times a night. She was seen at a walk in over the weekend and told that labs were normal today.   PMHx, SurgHx, SocialHx, Medications, and Allergies were reviewed in the Visit Navigator and updated as appropriate.  Current Medications    .  cholecalciferol (VITAMIN D) 1000 units tablet, Take 1,000 Units by mouth daily., Disp: , Rfl:  .  Dulaglutide (TRULICITY) 1.5 VF/6.4PP SOPN, Inject 1.5 mg into the skin once a week, Disp: 4 pen, Rfl: 4 .  fexofenadine (ALLEGRA ODT) 30 MG disintegrating tablet, Take 30 mg daily by mouth., Disp: , Rfl:  .  hydrochlorothiazide (HYDRODIURIL) 25 MG tablet, Take 1 tablet (25 mg total) by mouth daily., Disp: 90 tablet, Rfl: 3 .  KLOR-CON 10 10 MEQ tablet, take 1 tablet by mouth twice a day, Disp: 60 tablet, Rfl: 0 .  olmesartan (BENICAR) 20 MG tablet, Take 1 tablet (20 mg total) by mouth daily., Disp: 90 tablet, Rfl: 3 .  psyllium (REGULOID) 0.52 g capsule, Take 0.52 g by mouth daily., Disp: , Rfl:    Allergies  Allergen Reactions  . Amlodipine     Other reaction(s): OTHER  . Doxazosin     Other reaction(s): OTHER  . Lisinopril Other (See Comments)    unknown  . Spironolactone Other (See Comments)    Edema   Review of Systems   Pertinent items are noted in the HPI. Otherwise, ROS is negative.  Vitals   Vitals:   03/11/19 1425  BP: 126/82  Pulse: 98  Temp: 98.6 F (37 C)  TempSrc: Oral  SpO2: 97%  Weight: 186 lb (84.4 kg)  Height: 5' 2.5" (1.588 m)     Body mass index is 33.48 kg/m.  Physical Exam   Physical Exam Vitals signs and nursing note reviewed.  HENT:     Head: Normocephalic and atraumatic.  Eyes:   Pupils: Pupils are equal, round, and reactive to light.  Neck:     Musculoskeletal: Normal range of motion and neck supple.  Cardiovascular:     Rate and Rhythm: Normal rate and regular rhythm.     Heart sounds: Normal heart sounds.  Pulmonary:     Effort: Pulmonary effort is normal.  Abdominal:     Palpations: Abdomen is soft.  Skin:    General: Skin is warm.  Psychiatric:        Behavior: Behavior normal.    Assessment and Plan   [name] was seen today for dysmenorrhea.  Diagnoses and all orders for this visit:  Muscle spasm Comments: No evidence of DVT. Will obtain labs. PRN baclofen okay. Orders: -     baclofen (LIORESAL) 20 MG tablet; Take 1 tablet (20 mg total) by mouth 3 (three) times daily. -     Magnesium 400 MG TABS; Take 400 mg by mouth daily.  Chronic kidney disease, stage III (moderate) (HCC) Comments: Followed by Nephrology.  Essential hypertension Comments: Hx of hypokalemia, even on ACE and Spironolactone. Taking OTC potassium daily as well. BP at goal.    . Reviewed expectations re: course of current medical issues. . Discussed self-management of symptoms. . Outlined  signs and symptoms indicating need for more acute intervention. . Patient verbalized understanding and all questions were answered. Marland Kitchen Health Maintenance issues including appropriate healthy diet, exercise, and smoking avoidance were discussed with patient. . See orders for this visit as documented in the electronic medical record. . Patient received an After Visit Summary.  CMA served as Education administrator during this visit. History, Physical, and Plan performed by medical provider. The above documentation has been reviewed and is accurate and complete. Briscoe Deutscher, D.O.  Briscoe Deutscher, DO [place], [facility] 03/14/2019

## 2019-04-02 ENCOUNTER — Other Ambulatory Visit: Payer: Self-pay

## 2019-04-02 ENCOUNTER — Encounter: Payer: Self-pay | Admitting: Family Medicine

## 2019-04-02 ENCOUNTER — Ambulatory Visit (INDEPENDENT_AMBULATORY_CARE_PROVIDER_SITE_OTHER): Payer: BC Managed Care – PPO | Admitting: Family Medicine

## 2019-04-02 VITALS — BP 140/82 | HR 113 | Temp 98.5°F | Ht 62.5 in | Wt 186.2 lb

## 2019-04-02 DIAGNOSIS — N183 Chronic kidney disease, stage 3 unspecified: Secondary | ICD-10-CM

## 2019-04-02 DIAGNOSIS — Z0000 Encounter for general adult medical examination without abnormal findings: Secondary | ICD-10-CM | POA: Diagnosis not present

## 2019-04-02 DIAGNOSIS — E782 Mixed hyperlipidemia: Secondary | ICD-10-CM

## 2019-04-02 DIAGNOSIS — R7303 Prediabetes: Secondary | ICD-10-CM | POA: Diagnosis not present

## 2019-04-02 DIAGNOSIS — J324 Chronic pansinusitis: Secondary | ICD-10-CM

## 2019-04-02 DIAGNOSIS — E876 Hypokalemia: Secondary | ICD-10-CM

## 2019-04-02 DIAGNOSIS — I10 Essential (primary) hypertension: Secondary | ICD-10-CM

## 2019-04-02 MED ORDER — CEFDINIR 300 MG PO CAPS: 300.0000 mg | ORAL_CAPSULE | Freq: Two times a day (BID) | ORAL | 0 refills | Status: DC

## 2019-04-02 MED ORDER — PREDNISONE 5 MG PO TABS: ORAL_TABLET | ORAL | 0 refills | Status: DC

## 2019-04-02 NOTE — Progress Notes (Signed)
Subjective:    Carol [name] is a 53 y.o. female and is here for a comprehensive physical exam.  Sinus pain and pressure, maxillary and frontal. Hx of the same. Over 2 weeks now.   Last PAP 06/2018 with GYN. [facility].  Plan for walking. Leg cramps. Hx of hypokalemia. Discussed preworkout with electrolytes, magnesium.  Health Maintenance Due  Topic Date Due  . HIV Screening  12/06/1981  . PAP SMEAR-Modifier  12/07/1987  . INFLUENZA VACCINE  03/30/2019     Current Outpatient Medications:  .  cholecalciferol (VITAMIN D) 1000 units tablet, Take 1,000 Units by mouth daily., Disp: , Rfl:  .  Dulaglutide (TRULICITY) 1.5 WF/0.9NA SOPN, Inject 1.5 mg into the skin once a week, Disp: 4 pen, Rfl: 4 .  hydrochlorothiazide (HYDRODIURIL) 25 MG tablet, Take 1 tablet (25 mg total) by mouth daily., Disp: 90 tablet, Rfl: 3 .  KLOR-CON 10 10 MEQ tablet, TAKE ONE TABLET BY MOUTH TWICE DAILY , Disp: 60 tablet, Rfl: 0 .  olmesartan (BENICAR) 20 MG tablet, Take 1 tablet (20 mg total) by mouth daily., Disp: 90 tablet, Rfl: 3 .  psyllium (REGULOID) 0.52 g capsule, Take 0.52 g by mouth daily., Disp: , Rfl:  .  cefdinir (OMNICEF) 300 MG capsule, Take 1 capsule (300 mg total) by mouth 2 (two) times daily., Disp: 20 capsule, Rfl: 0 .  predniSONE (DELTASONE) 5 MG tablet, 6-5-4-3-2-1-0, Disp: 21 tablet, Rfl: 0  PMHx, SurgHx, SocialHx, Medications, and Allergies were reviewed in the Visit Navigator and updated as appropriate.   Past Medical History:  Diagnosis Date  . Allergy   . Anemia   . Arthritis   . Chicken pox   . GERD (gastroesophageal reflux disease)   . Hay fever   . High cholesterol   . Hypertension   . Kidney disease 2011     Past Surgical History:  Procedure Laterality Date  . ABDOMINAL HYSTERECTOMY  2007  . CESAREAN SECTION       Family History  Problem Relation Age of Onset  . Hypertension Mother   . High Cholesterol Mother   . Kidney disease Mother   . Colon polyps  Mother   . Hypertension Father   . Hypertension Sister   . High Cholesterol Brother   . Kidney disease Brother   . Hypertension Maternal Grandmother   . Heart attack Maternal Grandfather   . Depression Paternal Grandfather   . Kidney disease Paternal Grandfather   . Colon cancer Neg Hx   . Esophageal cancer Neg Hx   . Rectal cancer Neg Hx   . Stomach cancer Neg Hx     Social History   Tobacco Use  . Smoking status: Never Smoker  . Smokeless tobacco: Never Used  Substance Use Topics  . Alcohol use: Yes    Comment: occasionally  . Drug use: No    Review of Systems:   Pertinent items are noted in the HPI. Otherwise, ROS is negative.  Objective:   BP 140/82   Pulse (!) 113   Temp 98.5 F (36.9 C) (Temporal)   Ht 5' 2.5" (1.588 m)   Wt 186 lb 3.2 oz (84.5 kg)   LMP 11/27/2005   SpO2 98%   BMI 33.51 kg/m   General appearance: alert, cooperative and appears stated age. Head: normocephalic, without obvious abnormality, atraumatic. Neck: no adenopathy, supple, symmetrical, trachea midline; thyroid not enlarged, symmetric, no tenderness/mass/nodules. Lungs: clear to auscultation bilaterally. Heart: regular rate and rhythm Abdomen: soft,  non-tender; no masses,  no organomegaly. Extremities: extremities normal, atraumatic, no cyanosis or edema. Skin: skin color, texture, turgor normal, no rashes or lesions. Lymph: cervical, supraclavicular, and axillary nodes normal; no abnormal inguinal nodes palpated. Neurologic: grossly normal.  Assessment/Plan:   Carol [name] was seen today for annual exam.  Diagnoses and all orders for this visit:  Routine physical examination  Prediabetes -     Hemoglobin A1c  Chronic kidney disease, stage III (moderate) (HCC) -     CBC with Differential/Platelet -     Comprehensive metabolic panel  Hypokalemia -     Comprehensive metabolic panel -     Magnesium  Mixed hyperlipidemia -     Lipid panel -     LDL cholesterol,  direct  Essential hypertension  Chronic pansinusitis -     cefdinir (OMNICEF) 300 MG capsule; Take 1 capsule (300 mg total) by mouth 2 (two) times daily. -     predniSONE (DELTASONE) 5 MG tablet; 6-5-4-3-2-1-0    Patient Counseling: [x]    Nutrition: Stressed importance of moderation in sodium/caffeine intake, saturated fat and cholesterol, caloric balance, sufficient intake of fresh fruits, vegetables, fiber, calcium, iron, and 1 mg of folate supplement per day (for females capable of pregnancy).  [x]    Stressed the importance of regular exercise.   [x]    Substance Abuse: Discussed cessation/primary prevention of tobacco, alcohol, or other drug use; driving or other dangerous activities under the influence; availability of treatment for abuse.   [x]    Injury prevention: Discussed safety belts, safety helmets, smoke detector, smoking near bedding or upholstery.   [x]    Sexuality: Discussed sexually transmitted diseases, partner selection, use of condoms, avoidance of unintended pregnancy  and contraceptive alternatives.  [x]    Dental health: Discussed importance of regular tooth brushing, flossing, and dental visits.  [x]    Health maintenance and immunizations reviewed. Please refer to Health maintenance section.   Briscoe Deutscher, DO [place]

## 2019-04-03 LAB — COMPREHENSIVE METABOLIC PANEL
ALT: 21 U/L (ref 0–35)
AST: 14 U/L (ref 0–37)
Albumin: 4.2 g/dL (ref 3.5–5.2)
Alkaline Phosphatase: 78 U/L (ref 39–117)
BUN: 14 mg/dL (ref 6–23)
CO2: 31 mEq/L (ref 19–32)
Calcium: 10.2 mg/dL (ref 8.4–10.5)
Chloride: 98 mEq/L (ref 96–112)
Creatinine, Ser: 1.09 mg/dL (ref 0.40–1.20)
GFR: 63.7 mL/min (ref >60.00)
Glucose, Bld: 118 mg/dL — ABNORMAL HIGH (ref 70–99)
Potassium: 3.4 mEq/L — ABNORMAL LOW (ref 3.5–5.1)
Sodium: 137 mEq/L (ref 135–145)
Total Bilirubin: 0.2 mg/dL (ref 0.2–1.2)
Total Protein: 7.5 g/dL (ref 6.0–8.3)

## 2019-04-03 LAB — CBC WITH DIFFERENTIAL/PLATELET
Basophils Absolute: 0.1 10*3/uL (ref 0.0–0.1)
Basophils Relative: 1 % (ref 0.0–3.0)
Eosinophils Absolute: 0.1 10*3/uL (ref 0.0–0.7)
Eosinophils Relative: 1 % (ref 0.0–5.0)
HCT: 38.5 % (ref 36.0–46.0)
Hemoglobin: 12.2 g/dL (ref 12.0–15.0)
Lymphocytes Relative: 25 % (ref 12.0–46.0)
Lymphs Abs: 2.5 10*3/uL (ref 0.7–4.0)
MCHC: 31.8 g/dL (ref 30.0–36.0)
MCV: 74.7 fl — ABNORMAL LOW (ref 78.0–100.0)
Monocytes Absolute: 0.6 10*3/uL (ref 0.1–1.0)
Monocytes Relative: 5.6 % (ref 3.0–12.0)
Neutro Abs: 6.8 10*3/uL (ref 1.4–7.7)
Neutrophils Relative %: 67.4 % (ref 43.0–77.0)
Platelets: 390 10*3/uL (ref 150.0–400.0)
RBC: 5.15 Mil/uL — ABNORMAL HIGH (ref 3.87–5.11)
RDW: 15.1 % (ref 11.5–15.5)
WBC: 10 10*3/uL (ref 4.0–10.5)

## 2019-04-03 LAB — LIPID PANEL
Cholesterol: 217 mg/dL — ABNORMAL HIGH (ref 0–200)
HDL: 35.8 mg/dL — ABNORMAL LOW (ref >39.00)
NonHDL: 181.66
Total CHOL/HDL Ratio: 6
Triglycerides: 222 mg/dL — ABNORMAL HIGH (ref 0.0–149.0)
VLDL: 44.4 mg/dL — ABNORMAL HIGH (ref 0.0–40.0)

## 2019-04-03 LAB — LDL CHOLESTEROL, DIRECT: Direct LDL: 144 mg/dL

## 2019-04-03 LAB — HEMOGLOBIN A1C: Hgb A1c MFr Bld: 6 % (ref 4.6–6.5)

## 2019-04-03 LAB — MAGNESIUM: Magnesium: 1.9 mg/dL (ref 1.5–2.5)

## 2019-04-05 ENCOUNTER — Other Ambulatory Visit: Payer: Self-pay | Admitting: Family Medicine

## 2019-04-05 DIAGNOSIS — R600 Localized edema: Secondary | ICD-10-CM

## 2019-04-05 DIAGNOSIS — I10 Essential (primary) hypertension: Secondary | ICD-10-CM

## 2019-04-05 DIAGNOSIS — E876 Hypokalemia: Secondary | ICD-10-CM

## 2019-04-07 ENCOUNTER — Encounter: Payer: Self-pay | Admitting: Family Medicine

## 2019-04-08 ENCOUNTER — Other Ambulatory Visit: Payer: Self-pay

## 2019-04-08 ENCOUNTER — Telehealth: Payer: Self-pay

## 2019-04-08 DIAGNOSIS — E782 Mixed hyperlipidemia: Secondary | ICD-10-CM

## 2019-04-08 MED ORDER — ROSUVASTATIN CALCIUM 10 MG PO TABS: 5.0000 mg | ORAL_TABLET | Freq: Every day | ORAL | 3 refills | Status: DC

## 2019-04-08 NOTE — Telephone Encounter (Signed)
Copied from [place] [id number]. Topic: General - Call Back - No Documentation >> Apr 08, 2019 12:10 PM Reyne Dumas L wrote: Reason for CRM:   Pt believes she is returning a call to [place].

## 2019-04-08 NOTE — Telephone Encounter (Signed)
Spoke with pt to verify pharmacy.

## 2019-05-04 ENCOUNTER — Other Ambulatory Visit: Payer: Self-pay | Admitting: Family Medicine

## 2019-05-04 DIAGNOSIS — R600 Localized edema: Secondary | ICD-10-CM

## 2019-05-04 DIAGNOSIS — I10 Essential (primary) hypertension: Secondary | ICD-10-CM

## 2019-05-04 DIAGNOSIS — E876 Hypokalemia: Secondary | ICD-10-CM

## 2019-05-20 ENCOUNTER — Other Ambulatory Visit: Payer: Self-pay | Admitting: Family Medicine

## 2019-05-20 DIAGNOSIS — R600 Localized edema: Secondary | ICD-10-CM

## 2019-05-20 DIAGNOSIS — I10 Essential (primary) hypertension: Secondary | ICD-10-CM

## 2019-05-20 DIAGNOSIS — E876 Hypokalemia: Secondary | ICD-10-CM

## 2019-05-29 ENCOUNTER — Telehealth: Payer: Self-pay | Admitting: Family Medicine

## 2019-05-29 NOTE — Telephone Encounter (Signed)
I spoke with patient and she informed me that she is having some side effects from taking the Olmesartan.  Patient explains that she has had a stuffy and runny nose, and some lower back pain.  Patient also wanted to know if taking this medicine has caused her to have the leg cramping as well.  Patient asks if she could possibly change medications due to the symptoms she's been having.  I informed her that I would let Dr. Juleen China know and get back to her.

## 2019-05-29 NOTE — Telephone Encounter (Signed)
See note  Copied from [place] [id number]. Topic: General - Other >> May 29, 2019  9:21 AM Rainey Pines A wrote: Patient would like a callback from Dr. Juleen China nurse in regards to side effects of olmesartan (BENICAR) 20 MG tablet

## 2019-05-30 NOTE — Telephone Encounter (Signed)
She has been on Olmesartan for 2 months. Has she had a runny nose this whole time? Hx of low potassium and leg cramps. Triage for possible need for COVID test and have her f/u with new PCP (I recommend Sam) this week to recheck BP, labs.

## 2019-05-31 NOTE — Telephone Encounter (Signed)
Called patient she has had runny nose from start of meds 2 mo ago. No other covid symptoms. Would like to be seen on Monday app made with sam

## 2019-06-03 ENCOUNTER — Ambulatory Visit (INDEPENDENT_AMBULATORY_CARE_PROVIDER_SITE_OTHER): Payer: BC Managed Care – PPO | Admitting: Physician Assistant

## 2019-06-03 ENCOUNTER — Encounter: Payer: Self-pay | Admitting: Physician Assistant

## 2019-06-03 VITALS — Ht 62.5 in | Wt 181.5 lb

## 2019-06-03 DIAGNOSIS — T887XXA Unspecified adverse effect of drug or medicament, initial encounter: Secondary | ICD-10-CM

## 2019-06-03 DIAGNOSIS — I10 Essential (primary) hypertension: Secondary | ICD-10-CM

## 2019-06-03 NOTE — Progress Notes (Signed)
TELEPHONE ENCOUNTER   Patient verbally agreed to telephone visit and is aware that copayment and coinsurance may apply. Patient was treated using telemedicine according to accepted telemedicine protocols.  Location of the patient: home Location of provider: [place] office Names of all persons participating in the telemedicine service and role in the encounter: Inda Coke, PA, Carol [name] (patient), Anselmo Pickler LPN  Subjective:   Chief Complaint  Patient presents with  . Medication Problem     HPI   Medication problem Pt thinks having side effects from Olmesartan 20 mg. She has had a stuffy and runny nose, and some lower back pain since started on the medication 2 months ago. Has congestion with her mask. Pt also c/o leg cramps -- very intermittent. Denies: fever, cough, chills, SOB, chest pain.  Does not have a blood pressure machine to check.  Currently taking Olmesartan 20 mg and HCTZ 25 mg.    Patient Active Problem List   Diagnosis Date Noted  . Insulin resistance 02/27/2019  . Hypokalemia 07/18/2018  . Menopausal vaginal dryness 07/18/2018  . Essential hypertension 07/18/2018  . Weight gain 07/18/2018  . Mixed hyperlipidemia 04/13/2018  . Morbid obesity ([place]) 01/05/2018  . Prediabetes 10/02/2017  . Dyspepsia 12/07/2012  . Chronic kidney disease, stage III (moderate) 12/03/2010   Social History   Tobacco Use  . Smoking status: Never Smoker  . Smokeless tobacco: Never Used  Substance Use Topics  . Alcohol use: Yes    Comment: occasionally    Current Outpatient Medications:  .  cholecalciferol (VITAMIN D) 1000 units tablet, Take 1,000 Units by mouth daily., Disp: , Rfl:  .  Dulaglutide (TRULICITY) 1.5 0000000 SOPN, Inject 1.5 mg into the skin once a week, Disp: 4 pen, Rfl: 4 .  hydrochlorothiazide (HYDRODIURIL) 25 MG tablet, Take 1 tablet (25 mg total) by mouth daily., Disp: 90 tablet, Rfl: 3 .  KLOR-CON 10 10 MEQ tablet, TAKE ONE TABLET BY  MOUTH TWICE DAILY , Disp: 60 tablet, Rfl: 0 .  olmesartan (BENICAR) 20 MG tablet, Take 1 tablet (20 mg total) by mouth daily., Disp: 90 tablet, Rfl: 3 .  psyllium (REGULOID) 0.52 g capsule, Take 0.52 g by mouth daily., Disp: , Rfl:  .  rosuvastatin (CRESTOR) 10 MG tablet, Take 0.5 tablets (5 mg total) by mouth daily., Disp: 90 tablet, Rfl: 3 .  ketoconazole (NIZORAL) 2 % shampoo, , Disp: , Rfl:  Allergies  Allergen Reactions  . Amlodipine     Other reaction(s): OTHER  . Doxazosin     Other reaction(s): OTHER  . Lisinopril Other (See Comments)    unknown  . Spironolactone Other (See Comments)    Edema    Assessment & Plan:   1. Essential hypertension  Unable to assess as she is not in office and unfortunately does not have a blood pressure machine. She plans to come to office tomorrow for labs and BP check.  2. Side effect of medication  Will change medication as appropriate after follow-up tomorrow.    No orders of the defined types were placed in this encounter.  No orders of the defined types were placed in this encounter.   Inda Coke, [place] 06/03/2019  Time spent with the patient: 8 minutes, spent in obtaining information about her symptoms, reviewing her previous labs, evaluations, and treatments, counseling her about her condition (please see the discussed topics above), and developing a plan to further investigate it; she had a number of questions which I addressed.

## 2019-06-04 ENCOUNTER — Ambulatory Visit (INDEPENDENT_AMBULATORY_CARE_PROVIDER_SITE_OTHER): Payer: BC Managed Care – PPO | Admitting: Physician Assistant

## 2019-06-04 ENCOUNTER — Encounter: Payer: Self-pay | Admitting: Physician Assistant

## 2019-06-04 ENCOUNTER — Other Ambulatory Visit: Payer: Self-pay

## 2019-06-04 DIAGNOSIS — I10 Essential (primary) hypertension: Secondary | ICD-10-CM

## 2019-06-04 DIAGNOSIS — Z23 Encounter for immunization: Secondary | ICD-10-CM

## 2019-06-04 LAB — COMPREHENSIVE METABOLIC PANEL
ALT: 21 U/L (ref 0–35)
AST: 15 U/L (ref 0–37)
Albumin: 4.1 g/dL (ref 3.5–5.2)
Alkaline Phosphatase: 75 U/L (ref 39–117)
BUN: 11 mg/dL (ref 6–23)
CO2: 31 mEq/L (ref 19–32)
Calcium: 10.2 mg/dL (ref 8.4–10.5)
Chloride: 100 mEq/L (ref 96–112)
Creatinine, Ser: 1.09 mg/dL (ref 0.40–1.20)
GFR: 63.66 mL/min (ref >60.00)
Glucose, Bld: 97 mg/dL (ref 70–99)
Potassium: 3.9 mEq/L (ref 3.5–5.1)
Sodium: 138 mEq/L (ref 135–145)
Total Bilirubin: 0.3 mg/dL (ref 0.2–1.2)
Total Protein: 7.3 g/dL (ref 6.0–8.3)

## 2019-06-04 LAB — CBC WITH DIFFERENTIAL/PLATELET
Basophils Absolute: 0 10*3/uL (ref 0.0–0.1)
Basophils Relative: 0.4 % (ref 0.0–3.0)
Eosinophils Absolute: 0.1 10*3/uL (ref 0.0–0.7)
Eosinophils Relative: 1.6 % (ref 0.0–5.0)
HCT: 38.8 % (ref 36.0–46.0)
Hemoglobin: 12.2 g/dL (ref 12.0–15.0)
Lymphocytes Relative: 31 % (ref 12.0–46.0)
Lymphs Abs: 2.6 10*3/uL (ref 0.7–4.0)
MCHC: 31.6 g/dL (ref 30.0–36.0)
MCV: 74 fl — ABNORMAL LOW (ref 78.0–100.0)
Monocytes Absolute: 0.5 10*3/uL (ref 0.1–1.0)
Monocytes Relative: 5.7 % (ref 3.0–12.0)
Neutro Abs: 5.1 10*3/uL (ref 1.4–7.7)
Neutrophils Relative %: 61.3 % (ref 43.0–77.0)
Platelets: 397 10*3/uL (ref 150.0–400.0)
RBC: 5.24 Mil/uL — ABNORMAL HIGH (ref 3.87–5.11)
RDW: 15 % (ref 11.5–15.5)
WBC: 8.4 10*3/uL (ref 4.0–10.5)

## 2019-06-04 NOTE — Addendum Note (Signed)
Addended by: Erlene Quan on: 06/04/2019 08:31 AM   Modules accepted: Orders

## 2019-06-04 NOTE — Progress Notes (Signed)
Patient was seen by me yesterday virtually. She was asked to come in today for a blood pressure check, flu shot and labs.  Vitals:   06/04/19 1013  BP: 140/90  Pulse: (!) 101  Temp: 98.4 F (36.9 C)  SpO2: 98%    Will call patient with lab results and plan.    Inda Coke, PA-C [place], [facility] 06/04/2019  Follow-up: No follow-ups on file.

## 2019-06-05 ENCOUNTER — Telehealth: Payer: Self-pay

## 2019-06-05 NOTE — Telephone Encounter (Signed)
See result notes. 

## 2019-06-05 NOTE — Telephone Encounter (Signed)
Copied from [place] [id number]. Topic: General - Other >> Jun 05, 2019  3:42 PM Keene Breath wrote: Reason for CRM: Patient stated she is returning a call to [facility].  Please call patient back to discuss.  CB# [phone]

## 2019-06-07 ENCOUNTER — Other Ambulatory Visit: Payer: Self-pay | Admitting: Family Medicine

## 2019-06-07 DIAGNOSIS — E876 Hypokalemia: Secondary | ICD-10-CM

## 2019-06-07 DIAGNOSIS — R600 Localized edema: Secondary | ICD-10-CM

## 2019-06-07 DIAGNOSIS — I10 Essential (primary) hypertension: Secondary | ICD-10-CM

## 2019-06-17 ENCOUNTER — Other Ambulatory Visit: Payer: Self-pay | Admitting: Obstetrics & Gynecology

## 2019-06-17 DIAGNOSIS — Z1231 Encounter for screening mammogram for malignant neoplasm of breast: Secondary | ICD-10-CM

## 2019-06-27 ENCOUNTER — Other Ambulatory Visit: Payer: Self-pay | Admitting: Family Medicine

## 2019-06-27 DIAGNOSIS — E8881 Metabolic syndrome: Secondary | ICD-10-CM

## 2019-07-01 NOTE — Telephone Encounter (Signed)
Patient has seen you. Are you going to be new PCP? Ok to send in refills?

## 2019-07-02 ENCOUNTER — Other Ambulatory Visit: Payer: Self-pay | Admitting: *Deleted

## 2019-07-02 ENCOUNTER — Other Ambulatory Visit: Payer: Self-pay

## 2019-07-02 DIAGNOSIS — E8881 Metabolic syndrome: Secondary | ICD-10-CM

## 2019-07-02 MED ORDER — TRULICITY 1.5 MG/0.5ML ~~LOC~~ SOAJ: SUBCUTANEOUS | 4 refills | Status: DC

## 2019-07-02 MED ORDER — TRULICITY 1.5 MG/0.5ML ~~LOC~~ SOAJ: SUBCUTANEOUS | 1 refills | Status: DC

## 2019-07-02 NOTE — Telephone Encounter (Signed)
Please refill, 90 day supply with 3 refill. Thanks.

## 2019-07-02 NOTE — Telephone Encounter (Signed)
Dr. Jonni Sanger, pt has an upcoming appt with you 08/02/2019. Okay to refill her Trulicity?

## 2019-07-02 NOTE — Telephone Encounter (Signed)
Please call and see who she is interested in transferring to. I have not discussed this with her and would like for new PCP to fill.

## 2019-08-12 ENCOUNTER — Other Ambulatory Visit: Payer: Self-pay | Admitting: Family Medicine

## 2019-08-12 ENCOUNTER — Ambulatory Visit: Payer: BC Managed Care – PPO | Admitting: Family Medicine

## 2019-08-12 DIAGNOSIS — I10 Essential (primary) hypertension: Secondary | ICD-10-CM

## 2019-08-12 DIAGNOSIS — E876 Hypokalemia: Secondary | ICD-10-CM

## 2019-08-12 DIAGNOSIS — R600 Localized edema: Secondary | ICD-10-CM

## 2019-09-11 ENCOUNTER — Other Ambulatory Visit: Payer: Self-pay | Admitting: Family Medicine

## 2019-09-11 DIAGNOSIS — I10 Essential (primary) hypertension: Secondary | ICD-10-CM

## 2019-09-11 DIAGNOSIS — E876 Hypokalemia: Secondary | ICD-10-CM

## 2019-09-11 DIAGNOSIS — R600 Localized edema: Secondary | ICD-10-CM

## 2019-10-23 ENCOUNTER — Encounter: Payer: Self-pay | Admitting: Family Medicine

## 2019-10-23 ENCOUNTER — Other Ambulatory Visit: Payer: Self-pay

## 2019-10-23 ENCOUNTER — Ambulatory Visit (INDEPENDENT_AMBULATORY_CARE_PROVIDER_SITE_OTHER): Payer: BC Managed Care – PPO | Admitting: Family Medicine

## 2019-10-23 VITALS — BP 198/92 | HR 133 | Temp 98.0°F | Ht 62.5 in | Wt 180.2 lb

## 2019-10-23 DIAGNOSIS — E782 Mixed hyperlipidemia: Secondary | ICD-10-CM | POA: Diagnosis not present

## 2019-10-23 DIAGNOSIS — E669 Obesity, unspecified: Secondary | ICD-10-CM | POA: Insufficient documentation

## 2019-10-23 DIAGNOSIS — N1831 Chronic kidney disease, stage 3a: Secondary | ICD-10-CM | POA: Diagnosis not present

## 2019-10-23 DIAGNOSIS — E876 Hypokalemia: Secondary | ICD-10-CM

## 2019-10-23 DIAGNOSIS — I10 Essential (primary) hypertension: Secondary | ICD-10-CM | POA: Diagnosis not present

## 2019-10-23 DIAGNOSIS — R7303 Prediabetes: Secondary | ICD-10-CM

## 2019-10-23 HISTORY — DX: Obesity, unspecified: E66.9

## 2019-10-23 LAB — POCT GLYCOSYLATED HEMOGLOBIN (HGB A1C): Hemoglobin A1C: 5.4 % (ref 4.0–5.6)

## 2019-10-23 MED ORDER — LOSARTAN POTASSIUM 100 MG PO TABS: 100.0000 mg | ORAL_TABLET | Freq: Every day | ORAL | 3 refills | Status: DC

## 2019-10-23 MED ORDER — TRIAMTERENE-HCTZ 37.5-25 MG PO TABS: 1.0000 | ORAL_TABLET | Freq: Every day | ORAL | 3 refills | Status: DC

## 2019-10-23 MED ORDER — ROSUVASTATIN CALCIUM 10 MG PO TABS: 10.0000 mg | ORAL_TABLET | Freq: Every day | ORAL | 3 refills | Status: DC

## 2019-10-23 NOTE — Patient Instructions (Signed)
Please return in 6-8 weeks to recheck blood pressure and blood work.  Today I am stopping the trulicity and potassium and HCTZ. I am increasing the losartan to '100mg'$  daily and adding maxzide daily. Take the '10mg'$  crestor in the morning with your blood pressure pills.  Monitor your stress :)  Keep measuring your blood pressure at home for me.   It was a pleasure meeting you today! Thank you for choosing [place] to meet your healthcare needs! I truly look forward to working with you. If you have any questions or concerns, please send me a message via Mychart or call the office at [phone].   Stress, Adult Stress is a normal reaction to life events. Stress is what you feel when life demands more than you are used to, or more than you think you can handle. Some stress can be useful, such as studying for a test or meeting a deadline at work. Stress that occurs too often or for too long can cause problems. It can affect your emotional health and interfere with relationships and normal daily activities. Too much stress can weaken your body's defense system (immune system) and increase your risk for physical illness. If you already have a medical problem, stress can make it worse. What are the causes? All sorts of life events can cause stress. An event that causes stress for one person may not be stressful for another person. Major life events, whether positive or negative, commonly cause stress. Examples include:  Losing a job or starting a new job.  Losing a loved one.  Moving to a new town or home.  Getting married or divorced.  Having a baby.  Getting injured or sick. Less obvious life events can also cause stress, especially if they occur day after day or in combination with each other. Examples include:  Working long hours.  Driving in traffic.  Caring for children.  Being in debt.  Being in a difficult relationship. What are the signs or symptoms? Stress can cause emotional  symptoms, including:  Anxiety. This is feeling worried, afraid, on edge, overwhelmed, or out of control.  Anger, including irritation or impatience.  Depression. This is feeling sad, down, helpless, or guilty.  Trouble focusing, remembering, or making decisions. Stress can cause physical symptoms, including:  Aches and pains. These may affect your head, neck, back, stomach, or other areas of your body.  Tight muscles or a clenched jaw.  Low energy.  Trouble sleeping. Stress can cause unhealthy behaviors, including:  Eating to feel better (overeating) or skipping meals.  Working too much or putting off tasks.  Smoking, drinking alcohol, or using drugs to feel better. How is this diagnosed? Stress is diagnosed through an assessment by your health care provider. He or she may diagnose this condition based on:  Your symptoms and any stressful life events.  Your medical history.  Tests to rule out other causes of your symptoms. Depending on your condition, your health care provider may refer you to a specialist for further evaluation. How is this treated?  Stress management techniques are the recommended treatment for stress. Medicine is not typically recommended for the treatment of stress. Techniques to reduce your reaction to stressful life events include:  Stress identification. Monitor yourself for symptoms of stress and identify what causes stress for you. These skills may help you to avoid or prepare for stressful events.  Time management. Set your priorities, keep a calendar of events, and learn to say no. Taking these actions  can help you avoid making too many commitments. Techniques for coping with stress include:  Rethinking the problem. Try to think realistically about stressful events rather than ignoring them or overreacting. Try to find the positives in a stressful situation rather than focusing on the negatives.  Exercise. Physical exercise can release both  physical and emotional tension. The key is to find a form of exercise that you enjoy and do it regularly.  Relaxation techniques. These relax the body and mind. The key is to find one or more that you enjoy and use the techniques regularly. Examples include: ? Meditation, deep breathing, or progressive relaxation techniques. ? Yoga or tai chi. ? Biofeedback, mindfulness techniques, or journaling. ? Listening to music, being out in nature, or participating in other hobbies.  Practicing a healthy lifestyle. Eat a balanced diet, drink plenty of water, limit or avoid caffeine, and get plenty of sleep.  Having a strong support network. Spend time with family, friends, or other people you enjoy being around. Express your feelings and talk things over with someone you trust. Counseling or talk therapy with a mental health professional may be helpful if you are having trouble managing stress on your own. Follow these instructions at home: Lifestyle   Avoid drugs.  Do not use any products that contain nicotine or tobacco, such as cigarettes, e-cigarettes, and chewing tobacco. If you need help quitting, ask your health care provider.  Limit alcohol intake to no more than 1 drink a day for nonpregnant women and 2 drinks a day for men. One drink equals 12 oz of beer, 5 oz of wine, or 1 oz of hard liquor  Do not use alcohol or drugs to relax.  Eat a balanced diet that includes fresh fruits and vegetables, whole grains, lean meats, fish, eggs, and beans, and low-fat dairy. Avoid processed foods and foods high in added fat, sugar, and salt.  Exercise at least 30 minutes on 5 or more days each week.  Get 7-8 hours of sleep each night. General instructions   Practice stress management techniques as discussed with your health care provider.  Drink enough fluid to keep your urine clear or pale yellow.  Take over-the-counter and prescription medicines only as told by your health care  provider.  Keep all follow-up visits as told by your health care provider. This is important. Contact a health care provider if:  Your symptoms get worse.  You have new symptoms.  You feel overwhelmed by your problems and can no longer manage them on your own. Get help right away if:  You have thoughts of hurting yourself or others. If you ever feel like you may hurt yourself or others, or have thoughts about taking your own life, get help right away. You can go to your nearest emergency department or call:  Your local emergency services (911 in the [place]  A suicide crisis helpline, such as the [place] at 682-600-8588. This is open 24 hours a day. Summary  Stress is a normal reaction to life events. It can cause problems if it happens too often or for too long.  Practicing stress management techniques is the best way to treat stress.  Counseling or talk therapy with a mental health professional may be helpful if you are having trouble managing stress on your own. This information is not intended to replace advice given to you by your health care provider. Make sure you discuss any questions you have with your health care provider.  Document Revised: 03/15/2019 Document Reviewed: 10/05/2016 Elsevier Patient Education  [place].
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
## 2019-11-05 ENCOUNTER — Telehealth: Payer: Self-pay | Admitting: Family Medicine

## 2019-11-05 NOTE — Telephone Encounter (Signed)
Spoke with patient and notified her that I feel like that best thing to do is for her to set up an appointment with Dr. Jonni Sanger so she can discuss this symptoms with her. Please schedule patient a soon appointment. Thanks

## 2019-11-05 NOTE — Telephone Encounter (Signed)
Patient called in this afternoon wanting to speak to someone about her heart rate, she sates she was here on the 24th, and her HR was up, dicussed possible anxiety but feels like It is still up and wanted an opinion from [facility] to see what to do. Tried scheduling an appointment to discuss this but patient states she would like to hear from [facility] or [facility] first.

## 2019-11-06 ENCOUNTER — Encounter: Payer: Self-pay | Admitting: Family Medicine

## 2019-11-06 ENCOUNTER — Ambulatory Visit (INDEPENDENT_AMBULATORY_CARE_PROVIDER_SITE_OTHER): Payer: BC Managed Care – PPO | Admitting: Family Medicine

## 2019-11-06 ENCOUNTER — Other Ambulatory Visit: Payer: Self-pay

## 2019-11-06 VITALS — BP 127/86 | HR 117 | Ht 62.5 in | Wt 180.0 lb

## 2019-11-06 DIAGNOSIS — R002 Palpitations: Secondary | ICD-10-CM | POA: Diagnosis not present

## 2019-11-06 DIAGNOSIS — R000 Tachycardia, unspecified: Secondary | ICD-10-CM | POA: Diagnosis not present

## 2019-11-06 DIAGNOSIS — I10 Essential (primary) hypertension: Secondary | ICD-10-CM | POA: Diagnosis not present

## 2019-11-06 MED ORDER — ALPRAZOLAM 0.5 MG PO TABS: 0.5000 mg | ORAL_TABLET | Freq: Every day | ORAL | 0 refills | Status: DC | PRN

## 2019-11-06 NOTE — Telephone Encounter (Signed)
Patient is scheduled for today with Jonni Sanger at 4 virtually.

## 2019-11-06 NOTE — Progress Notes (Signed)
Virtual Visit via Video Note  Subjective  CC:  Chief Complaint  Patient presents with  . Tachycardia    believes it's stress and anxiety. Off and on since for a few months since patient lost her job     I connected with Carol [name] on 11/06/19 at  4:30 PM EST by a video enabled telemedicine application and verified that I am speaking with the correct person using two identifiers. Location patient: Home Location provider: [facility] [place] participating in the virtual visit: Starlet Burklee Bi, Leamon Arnt, MD Serita Sheller, CMA  I discussed the limitations of evaluation and management by telemedicine and the availability of in person appointments. The patient expressed understanding and agreed to proceed. HPI: Carol [name] is a 54 y.o. female who was contacted today to address the problems listed above in the chief complaint/HTN f/u: Pt c/o feels like her heart is racing off and on over the last month or so. Happening more frequently. Checked bp and HR - bp is improved but HR was 117 or > 100 on several occassions. No skipped beats or chest pain. No associated sob. Admits to increase in anxiety and some panic sxs. Has multiple stressors. No LE edema, no tremors or sweats. See last note. Reviewed labs.  . Hypertension f/u: Control is improved. Pt reports she is doing well. taking medications as instructed, no medication side effects noted, no TIAs, no chest pain on exertion, no dyspnea on exertion, no swelling of ankles. Reports bp was recently 127/86 this am.  She denies adverse effects from his BP medications. Compliance with medication is good.   BP Readings from Last 3 Encounters:  11/06/19 127/86  10/23/19 (!) 198/92  06/04/19 140/90   Wt Readings from Last 3 Encounters:  11/06/19 180 lb (81.6 kg)  10/23/19 180 lb 3.2 oz (81.7 kg)  06/04/19 186 lb (84.4 kg)    Lab Results  Component Value Date   CHOL 217 (H) 04/02/2019   CHOL 178  04/13/2018   CHOL 208 (H) 07/11/2017   Lab Results  Component Value Date   HDL 35.80 (L) 04/02/2019   HDL 39.50 04/13/2018   HDL 35 (L) 07/11/2017   Lab Results  Component Value Date   LDLCALC 113 (H) 04/13/2018   LDLCALC 142 (H) 07/11/2017   LDLCALC 128 06/27/2016   Lab Results  Component Value Date   TRIG 222.0 (H) 04/02/2019   TRIG 126.0 04/13/2018   TRIG 155 (H) 07/11/2017   Lab Results  Component Value Date   CHOLHDL 6 04/02/2019   CHOLHDL 4 04/13/2018   CHOLHDL 5.9 (H) 07/11/2017   Lab Results  Component Value Date   LDLDIRECT 144.0 04/02/2019   Lab Results  Component Value Date   CREATININE 1.09 06/04/2019   BUN 11 06/04/2019   NA 138 06/04/2019   K 3.9 06/04/2019   CL 100 06/04/2019   CO2 31 06/04/2019    The 10-year ASCVD risk score Mikey Bussing DC Jr., et al., 2013) is: 7%   Values used to calculate the score:     Age: 68 years     Sex: Female     Is Non-Hispanic African American: Yes     Diabetic: No     Tobacco smoker: No     Systolic Blood Pressure: [id number]     Is BP treated: Yes     HDL Cholesterol: 35.8 mg/dL     Total Cholesterol: 217  mg/dL  Assessment  1. Palpitations   2. Tachycardia   3. White coat syndrome with diagnosis of hypertension      Plan   Hypertension f/u:  Improved by home readings  Palpitations: rec in office visit so I may examine her, get EKG, and get lab work. May warrant holter monitor if verified. ? Related to anxiety or deconditioning. She will get more date with bid bp and HR checks and monitor a pattern to her sxs. F/u next week. No red flag sxs identified: discussed what to do if cp or persistent very elevated HR or associated sxs develop.    I discussed the assessment and treatment plan with the patient. The patient was provided an opportunity to ask questions and all were answered. The patient agreed with the plan and demonstrated an understanding of the instructions.   The patient was advised to call back or  seek an in-person evaluation if the symptoms worsen or if the condition fails to improve as anticipated. Follow up: No follow-ups on file.  12/04/2019  Meds ordered this encounter  Medications  . ALPRAZolam (XANAX) 0.5 MG tablet    Sig: Take 1 tablet (0.5 mg total) by mouth daily as needed for anxiety.    Dispense:  20 tablet    Refill:  0      I reviewed the patients updated PMH, FH, and SocHx.    Patient Active Problem List   Diagnosis Date Noted  . Obesity (BMI 30-39.9) 10/23/2019    Priority: High  . White coat syndrome with diagnosis of hypertension 07/18/2018    Priority: High  . Mixed hyperlipidemia 04/13/2018    Priority: High  . Prediabetes 10/02/2017    Priority: High  . Chronic kidney disease, stage III (moderate) 12/03/2010    Priority: High  . Menopausal vaginal dryness 07/18/2018    Priority: Low  . Hypokalemia 07/18/2018   Current Meds  Medication Sig  . cholecalciferol (VITAMIN D) 1000 units tablet Take 1,000 Units by mouth daily.  Marland Kitchen ketoconazole (NIZORAL) 2 % shampoo   . losartan (COZAAR) 100 MG tablet Take 1 tablet (100 mg total) by mouth daily.  Marland Kitchen nystatin cream (MYCOSTATIN) Apply 1 application topically as needed.  . psyllium (REGULOID) 0.52 g capsule Take 0.52 g by mouth daily.  . rosuvastatin (CRESTOR) 10 MG tablet Take 1 tablet (10 mg total) by mouth daily.  Marland Kitchen triamterene-hydrochlorothiazide (MAXZIDE-25) 37.5-25 MG tablet Take 1 tablet by mouth daily.    Allergies: Patient is allergic to amlodipine; doxazosin; lisinopril; and spironolactone. Family History: Patient family history includes Colon polyps in her mother; Depression in her paternal grandfather; Heart attack in her maternal grandfather; High Cholesterol in her brother and mother; Hypertension in her father, maternal grandmother, mother, and sister; Kidney disease in her brother, mother, and paternal grandfather. Social History:  Patient  reports that she has never smoked. She has never  used smokeless tobacco. She reports current alcohol use. She reports that she does not use drugs.  Review of Systems: Constitutional: Negative for fever malaise or anorexia Cardiovascular: negative for chest pain Respiratory: negative for SOB or persistent cough Gastrointestinal: negative for abdominal pain  OBJECTIVE Vitals: BP 127/86 Comment: by home reading  Pulse (!) 117   Ht 5' 2.5" (1.588 m)   Wt 180 lb (81.6 kg)   LMP 11/27/2005   BMI 32.40 kg/m  General: no acute distress , A&Ox3  Leamon Arnt, MD

## 2019-11-15 ENCOUNTER — Ambulatory Visit: Payer: BC Managed Care – PPO | Admitting: Family Medicine

## 2019-11-20 ENCOUNTER — Other Ambulatory Visit: Payer: Self-pay

## 2019-11-21 ENCOUNTER — Encounter: Payer: Self-pay | Admitting: Obstetrics & Gynecology

## 2019-11-21 ENCOUNTER — Ambulatory Visit
Admission: RE | Admit: 2019-11-21 | Discharge: 2019-11-21 | Disposition: A | Discharge status: Home / Self Care | Payer: BC Managed Care – PPO | Source: Ambulatory Visit | Attending: Obstetrics & Gynecology | Admitting: Obstetrics & Gynecology

## 2019-11-21 ENCOUNTER — Ambulatory Visit: Payer: BC Managed Care – PPO | Admitting: Obstetrics & Gynecology

## 2019-11-21 VITALS — BP 160/82 | HR 104 | Temp 98.1°F | Ht 62.5 in | Wt 177.0 lb

## 2019-11-21 DIAGNOSIS — Z1231 Encounter for screening mammogram for malignant neoplasm of breast: Secondary | ICD-10-CM

## 2019-11-21 DIAGNOSIS — N898 Other specified noninflammatory disorders of vagina: Secondary | ICD-10-CM | POA: Diagnosis not present

## 2019-11-21 DIAGNOSIS — Z01419 Encounter for gynecological examination (general) (routine) without abnormal findings: Secondary | ICD-10-CM

## 2019-11-21 NOTE — Progress Notes (Signed)
54 y.o. G1P2002 Married [place] American female here for annual exam.  Doing well.  Has MMG scheduled today.  Is going to have Covid vaccination scheduled after that.    She lost her job permanently in October.  Worked at [employer]  Denies vaginal bleeding.    Saw Dr. Jonni Sanger in February.  HbA1C was 5.4  Patient's last menstrual period was 11/27/2005.          Sexually active: Yes.    The current method of family planning is status post hysterectomy.    Exercising: No.  The patient does not participate in regular exercise at present. Smoker:  no  Health Maintenance: Pap:  2007 Neg History of abnormal Pap:  no MMG:  07/16/18 BIRADS 1 negative/density c.  Appt is today. Colonoscopy:  09/18/17 f/u 10 years BMD:   never TDaP:  2016 Pneumonia vaccine(s):  n/a Shingrix:   no Hep C testing: n/a Screening Labs: PCP   reports that she has never smoked. She has never used smokeless tobacco. She reports current alcohol use. She reports that she does not use drugs.  Past Medical History:  Diagnosis Date  . Allergy   . Anemia   . Arthritis   . Chicken pox   . GERD (gastroesophageal reflux disease)   . Hay fever   . High cholesterol   . Hypertension   . Kidney disease 2011  . Obesity (BMI 30-39.9) 10/23/2019    Past Surgical History:  Procedure Laterality Date  . ABDOMINAL HYSTERECTOMY  2007  . CESAREAN SECTION      Current Outpatient Medications  Medication Sig Dispense Refill  . ALPRAZolam (XANAX) 0.5 MG tablet Take 1 tablet (0.5 mg total) by mouth daily as needed for anxiety. 20 tablet 0  . cholecalciferol (VITAMIN D) 1000 units tablet Take 1,000 Units by mouth daily.    Marland Kitchen ketoconazole (NIZORAL) 2 % shampoo     . nystatin cream (MYCOSTATIN) Apply 1 application topically as needed.    . psyllium (REGULOID) 0.52 g capsule Take 0.52 g by mouth daily.    . rosuvastatin (CRESTOR) 10 MG tablet Take 1 tablet (10 mg total) by mouth daily. 90 tablet 3  .  triamterene-hydrochlorothiazide (MAXZIDE-25) 37.5-25 MG tablet Take 1 tablet by mouth daily. 90 tablet 3   No current facility-administered medications for this visit.    Family History  Problem Relation Age of Onset  . Hypertension Mother   . High Cholesterol Mother   . Kidney disease Mother   . Colon polyps Mother   . Hypertension Father   . Hypertension Sister   . High Cholesterol Brother   . Kidney disease Brother   . Hypertension Maternal Grandmother   . Heart attack Maternal Grandfather   . Depression Paternal Grandfather   . Kidney disease Paternal Grandfather   . Colon cancer Neg Hx   . Esophageal cancer Neg Hx   . Rectal cancer Neg Hx   . Stomach cancer Neg Hx     Review of Systems  All other systems reviewed and are negative.   Exam:   BP (!) 160/82 (BP Location: Right Arm, Patient Position: Sitting, Cuff Size: Normal)   Pulse (!) 104   Temp 98.1 F (36.7 C) (Temporal)   Ht 5' 2.5" (1.588 m)   Wt 177 lb (80.3 kg)   LMP 11/27/2005   BMI 31.86 kg/m     Height: 5' 2.5" (158.8 cm)  Ht Readings from Last 3 Encounters:  11/21/19 5'  2.5" (1.588 m)  11/06/19 5' 2.5" (1.588 m)  10/23/19 5' 2.5" (1.588 m)    General appearance: alert, cooperative and appears stated age Head: Normocephalic, without obvious abnormality, atraumatic Neck: no adenopathy, supple, symmetrical, trachea midline and thyroid normal to inspection and palpation Lungs: clear to auscultation bilaterally Breasts: normal appearance, no masses or tenderness Heart: regular rate and rhythm Abdomen: soft, non-tender; bowel sounds normal; no masses,  no organomegaly Extremities: extremities normal, atraumatic, no cyanosis or edema Skin: Skin color, texture, turgor normal. No rashes or lesions Lymph nodes: Cervical, supraclavicular, and axillary nodes normal. No abnormal inguinal nodes palpated Neurologic: Grossly normal   Pelvic: External genitalia:  no lesions              Urethra:  normal  appearing urethra with no masses, tenderness or lesions              Bartholins and Skenes: normal                 Vagina: normal appearing vagina with normal color and white discharge, no lesions              Cervix: absent              Pap taken: No. Bimanual Exam:  Uterus:  uterus absent              Adnexa: no mass, fullness, tenderness               Rectovaginal: Confirms               Anus:  normal sphincter tone, no lesions, internal hemorrhoids  Chaperone, Terence Lux, CMA, was present for exam.  A:  Well Woman with normal exam H/O TAH 2007 due to fibroids Hypertension Chronic kidney disease, Stage III Hyperlipidemia H/o prediabetes White vaginal discharge  P:   Mammogram is scheduled today  pap smear not indicated due to TAH hx Colonoscopy UTD Lab work UTD Vaccines reviewed.  Helped register pt for covid vaccination in [facility] obtained today Return annually or prn

## 2019-11-22 ENCOUNTER — Other Ambulatory Visit: Payer: Self-pay

## 2019-11-22 LAB — VAGINITIS/VAGINOSIS, DNA PROBE
Candida Species: POSITIVE — AB
Gardnerella vaginalis: NEGATIVE
Trichomonas vaginosis: NEGATIVE

## 2019-11-22 MED ORDER — FLUCONAZOLE 150 MG PO TABS: ORAL_TABLET | ORAL | 0 refills | Status: DC

## 2019-12-04 ENCOUNTER — Ambulatory Visit: Payer: BC Managed Care – PPO | Admitting: Family Medicine

## 2019-12-13 ENCOUNTER — Encounter: Payer: Self-pay | Admitting: Family Medicine

## 2019-12-13 ENCOUNTER — Other Ambulatory Visit: Payer: Self-pay

## 2019-12-13 ENCOUNTER — Ambulatory Visit (INDEPENDENT_AMBULATORY_CARE_PROVIDER_SITE_OTHER): Payer: BC Managed Care – PPO | Admitting: Family Medicine

## 2019-12-13 VITALS — BP 128/72 | HR 130 | Temp 98.5°F | Resp 17 | Ht 63.0 in | Wt 180.2 lb

## 2019-12-13 DIAGNOSIS — I10 Essential (primary) hypertension: Secondary | ICD-10-CM

## 2019-12-13 DIAGNOSIS — R000 Tachycardia, unspecified: Secondary | ICD-10-CM

## 2019-12-13 DIAGNOSIS — R002 Palpitations: Secondary | ICD-10-CM | POA: Diagnosis not present

## 2019-12-13 DIAGNOSIS — N1831 Chronic kidney disease, stage 3a: Secondary | ICD-10-CM | POA: Diagnosis not present

## 2019-12-13 LAB — COMPREHENSIVE METABOLIC PANEL
ALT: 18 U/L (ref 0–35)
AST: 13 U/L (ref 0–37)
Albumin: 4 g/dL (ref 3.5–5.2)
Alkaline Phosphatase: 80 U/L (ref 39–117)
BUN: 15 mg/dL (ref 6–23)
CO2: 30 mEq/L (ref 19–32)
Calcium: 9.8 mg/dL (ref 8.4–10.5)
Chloride: 99 mEq/L (ref 96–112)
Creatinine, Ser: 1.19 mg/dL (ref 0.40–1.20)
GFR: 57.41 mL/min — ABNORMAL LOW (ref >60.00)
Glucose, Bld: 116 mg/dL — ABNORMAL HIGH (ref 70–99)
Potassium: 3.5 mEq/L (ref 3.5–5.1)
Sodium: 137 mEq/L (ref 135–145)
Total Bilirubin: 0.3 mg/dL (ref 0.2–1.2)
Total Protein: 7.2 g/dL (ref 6.0–8.3)

## 2019-12-13 LAB — CBC WITH DIFFERENTIAL/PLATELET
Basophils Absolute: 0 10*3/uL (ref 0.0–0.1)
Basophils Relative: 0.3 % (ref 0.0–3.0)
Eosinophils Absolute: 0.1 10*3/uL (ref 0.0–0.7)
Eosinophils Relative: 1.1 % (ref 0.0–5.0)
HCT: 38.6 % (ref 36.0–46.0)
Hemoglobin: 12.3 g/dL (ref 12.0–15.0)
Lymphocytes Relative: 26.9 % (ref 12.0–46.0)
Lymphs Abs: 2.2 10*3/uL (ref 0.7–4.0)
MCHC: 31.9 g/dL (ref 30.0–36.0)
MCV: 73.9 fl — ABNORMAL LOW (ref 78.0–100.0)
Monocytes Absolute: 0.4 10*3/uL (ref 0.1–1.0)
Monocytes Relative: 4.8 % (ref 3.0–12.0)
Neutro Abs: 5.6 10*3/uL (ref 1.4–7.7)
Neutrophils Relative %: 66.9 % (ref 43.0–77.0)
Platelets: 350 10*3/uL (ref 150.0–400.0)
RBC: 5.22 Mil/uL — ABNORMAL HIGH (ref 3.87–5.11)
RDW: 15.4 % (ref 11.5–15.5)
WBC: 8.3 10*3/uL (ref 4.0–10.5)

## 2019-12-13 LAB — TSH: TSH: 3.82 u[IU]/mL (ref 0.35–4.50)

## 2019-12-13 MED ORDER — METOPROLOL SUCCINATE ER 25 MG PO TB24: 12.5000 mg | ORAL_TABLET | Freq: Every day | ORAL | 3 refills | Status: DC

## 2019-12-13 MED ORDER — LOSARTAN POTASSIUM 100 MG PO TABS: 100.0000 mg | ORAL_TABLET | Freq: Every day | ORAL | 3 refills | Status: DC

## 2019-12-13 NOTE — Patient Instructions (Signed)
Please return in 6 weeks to recheck blood pressure and palpitations.   Please add the 1/2 pill of toprol xl daily to your losartan and maxzide.   If you have any questions or concerns, please don't hesitate to send me a message via MyChart or call the office at [phone]. Thank you for visiting with [place] today! It's our pleasure caring for you.

## 2019-12-13 NOTE — Progress Notes (Signed)
Subjective  CC:  Chief Complaint  Patient presents with  . Palpitations    states new dosage of medications are tolerated well, denies any recent episodes of palpitations, BP <134/90 at home    HPI: Carol [name] is a 54 y.o. female who presents to the office today to address the problems listed above in the chief complaint.  Hypertension f/u: Control is good . Pt reports she is doing well. taking medications as instructed, no medication side effects noted, no TIAs, no chest pain on exertion, no dyspnea on exertion, no swelling of ankles. Home readings are consistently normal on maxzide and losartan 100 (not sure why it has fallen off of her med list).  She denies adverse effects from his BP medications. Compliance with medication is good.   Palpitations and tachycardia; reports chronic tachy over the years much worse when stressed. Feels palpitations have lessened in frequency and are likely a results of stress. No chest tightnes, sob, doe, sweats or exertional sxs. She is nervous here in the office per her usual: hr and bp always elevate in office.   H/o hypokalmia   Assessment  1. Palpitations   2. Tachycardia   3. White coat syndrome with diagnosis of hypertension   4. Stage 3a chronic kidney disease   5. Sinus tachycardia      Plan    Hypertension f/u: BP control is fairly well controlled. Add low dose bb see next. Continue home monitoring due to significant white coat sxs.   Palpitations and sinus tach: start low dose bb and check labs. Education given.  Education regarding management of these chronic disease states was given. Management strategies discussed on successive visits include dietary and exercise recommendations, goals of achieving and maintaining IBW, and lifestyle modifications aiming for adequate sleep and minimizing stressors.   Follow up: Return in about 6 weeks (around 01/24/2020) for follow up Hypertension and palpitations.  Orders Placed This  Encounter  Procedures  . CBC with Differential/Platelet  . TSH  . Comprehensive metabolic panel  . EKG 12-Lead   Meds ordered this encounter  Medications  . metoprolol succinate (TOPROL-XL) 25 MG 24 hr tablet    Sig: Take 0.5 tablets (12.5 mg total) by mouth daily.    Dispense:  45 tablet    Refill:  3  . losartan (COZAAR) 100 MG tablet    Sig: Take 1 tablet (100 mg total) by mouth daily.    Dispense:  90 tablet    Refill:  3      BP Readings from Last 3 Encounters:  12/13/19 128/72  11/21/19 (!) 160/82  11/06/19 127/86   Wt Readings from Last 3 Encounters:  12/13/19 180 lb 3.2 oz (81.7 kg)  11/21/19 177 lb (80.3 kg)  11/06/19 180 lb (81.6 kg)    Lab Results  Component Value Date   CHOL 217 (H) 04/02/2019   CHOL 178 04/13/2018   CHOL 208 (H) 07/11/2017   Lab Results  Component Value Date   HDL 35.80 (L) 04/02/2019   HDL 39.50 04/13/2018   HDL 35 (L) 07/11/2017   Lab Results  Component Value Date   LDLCALC 113 (H) 04/13/2018   LDLCALC 142 (H) 07/11/2017   LDLCALC 128 06/27/2016   Lab Results  Component Value Date   TRIG 222.0 (H) 04/02/2019   TRIG 126.0 04/13/2018   TRIG 155 (H) 07/11/2017   Lab Results  Component Value Date   CHOLHDL 6 04/02/2019   CHOLHDL 4 04/13/2018  CHOLHDL 5.9 (H) 07/11/2017   Lab Results  Component Value Date   LDLDIRECT 144.0 04/02/2019   Lab Results  Component Value Date   CREATININE 1.09 06/04/2019   BUN 11 06/04/2019   NA 138 06/04/2019   K 3.9 06/04/2019   CL 100 06/04/2019   CO2 31 06/04/2019    The 10-year ASCVD risk score Mikey Bussing DC Jr., et al., 2013) is: 7.5%   Values used to calculate the score:     Age: 48 years     Sex: Female     Is Non-Hispanic African American: Yes     Diabetic: No     Tobacco smoker: No     Systolic Blood Pressure: 0000000 mmHg     Is BP treated: Yes     HDL Cholesterol: 35.8 mg/dL     Total Cholesterol: 217 mg/dL  I reviewed the patients updated PMH, FH, and SocHx.    Patient  Active Problem List   Diagnosis Date Noted  . Obesity (BMI 30-39.9) 10/23/2019    Priority: High  . White coat syndrome with diagnosis of hypertension 07/18/2018    Priority: High  . Mixed hyperlipidemia 04/13/2018    Priority: High  . Prediabetes 10/02/2017    Priority: High  . Chronic kidney disease, stage III (moderate) 12/03/2010    Priority: High  . Menopausal vaginal dryness 07/18/2018    Priority: Low  . Sinus tachycardia 12/13/2019  . Hypokalemia 07/18/2018    Allergies: Amlodipine, Doxazosin, Lisinopril, and Spironolactone  Social History: Patient  reports that she has never smoked. She has never used smokeless tobacco. She reports current alcohol use. She reports that she does not use drugs.  Current Meds  Medication Sig  . ALPRAZolam (XANAX) 0.5 MG tablet Take 1 tablet (0.5 mg total) by mouth daily as needed for anxiety.  . cholecalciferol (VITAMIN D) 1000 units tablet Take 1,000 Units by mouth daily.  Marland Kitchen ketoconazole (NIZORAL) 2 % shampoo   . nystatin cream (MYCOSTATIN) Apply 1 application topically as needed.  . psyllium (REGULOID) 0.52 g capsule Take 0.52 g by mouth daily.  . rosuvastatin (CRESTOR) 10 MG tablet Take 1 tablet (10 mg total) by mouth daily.  Marland Kitchen triamterene-hydrochlorothiazide (MAXZIDE-25) 37.5-25 MG tablet Take 1 tablet by mouth daily.    Review of Systems: Cardiovascular: negative for chest pain, palpitations, leg swelling, orthopnea Respiratory: negative for SOB, wheezing or persistent cough Gastrointestinal: negative for abdominal pain Genitourinary: negative for dysuria or gross hematuria  Objective  Vitals: BP 128/72 Comment: by consistent home readings  Pulse (!) 130   Temp 98.5 F (36.9 C) (Temporal)   Resp 17   Ht 5\' 3"  (1.6 m)   Wt 180 lb 3.2 oz (81.7 kg)   LMP 11/27/2005   SpO2 99%   BMI 31.92 kg/m  General: no acute distress  Psych:  Alert and oriented, normal mood and affect HEENT:  Normocephalic, atraumatic, supple neck   Cardiovascular:  Tachy with reg rhythm, without murmur. no edema Respiratory:  Good breath sounds bilaterally, CTAB with normal respiratory effort Skin:  Warm, no rashes Neurologic:   Mental status is normal  Ekg:  Sinus tach at 118, nonspecific T wave changes. No q waves. Atrial enlargement. No comparison.    Commons side effects, risks, benefits, and alternatives for medications and treatment plan prescribed today were discussed, and the patient expressed understanding of the given instructions. Patient is instructed to call or message via MyChart if he/she has any questions or concerns regarding our  treatment plan. No barriers to understanding were identified. We discussed Red Flag symptoms and signs in detail. Patient expressed understanding regarding what to do in case of urgent or emergency type symptoms.   Medication list was reconciled, printed and provided to the patient in AVS. Patient instructions and summary information was reviewed with the patient as documented in the AVS. This note was prepared with assistance of Dragon voice recognition software. Occasional wrong-word or sound-a-like substitutions may have occurred due to the inherent limitations of voice recognition software  This visit occurred during the SARS-CoV-2 public health emergency.  Safety protocols were in place, including screening questions prior to the visit, additional usage of staff PPE, and extensive cleaning of exam room while observing appropriate contact time as indicated for disinfecting solutions.

## 2020-01-08 ENCOUNTER — Telehealth: Payer: Self-pay | Admitting: Family Medicine

## 2020-01-08 ENCOUNTER — Other Ambulatory Visit: Payer: Self-pay

## 2020-01-08 DIAGNOSIS — E782 Mixed hyperlipidemia: Secondary | ICD-10-CM

## 2020-01-08 MED ORDER — ROSUVASTATIN CALCIUM 10 MG PO TABS: 10.0000 mg | ORAL_TABLET | Freq: Every day | ORAL | 3 refills | Status: DC

## 2020-01-08 NOTE — Telephone Encounter (Signed)
MEDICATION: Crestor   PHARMACY: [facility] [place], [place]  Comments: Pt states her Crestor was supposed to be increased.  She went to pharmacy and there was no script sent in with the new dosage. Please advise.   **Let patient know to contact pharmacy at the end of the day to make sure medication is ready. **  ** Please notify patient to allow 48-72 hours to process**  **Encourage patient to contact the pharmacy for refills or they can request refills through [facility]**

## 2020-01-08 NOTE — Telephone Encounter (Signed)
Spoke with patient. Resent script to pharmacy and verified she needs to take 1 tablet (10 mg total) by mouth every morning

## 2020-01-22 ENCOUNTER — Telehealth: Payer: Self-pay

## 2020-01-22 ENCOUNTER — Other Ambulatory Visit: Payer: Self-pay

## 2020-01-22 DIAGNOSIS — E782 Mixed hyperlipidemia: Secondary | ICD-10-CM

## 2020-01-22 MED ORDER — ROSUVASTATIN CALCIUM 10 MG PO TABS: 10.0000 mg | ORAL_TABLET | Freq: Every day | ORAL | 3 refills | Status: DC

## 2020-01-22 NOTE — Telephone Encounter (Signed)
Patient requesting a call back from nurse regarding rosuvastatin (CRESTOR) 10 MG tablet medication. Patient have additional question regarding this medication.

## 2020-01-22 NOTE — Telephone Encounter (Signed)
Script sent to [facility] in [place], [place]

## 2020-01-24 ENCOUNTER — Ambulatory Visit: Payer: BC Managed Care – PPO | Admitting: Family Medicine

## 2020-01-28 ENCOUNTER — Other Ambulatory Visit: Payer: Self-pay

## 2020-01-28 ENCOUNTER — Telehealth: Payer: Self-pay | Admitting: Family Medicine

## 2020-01-28 DIAGNOSIS — E782 Mixed hyperlipidemia: Secondary | ICD-10-CM

## 2020-01-28 MED ORDER — ROSUVASTATIN CALCIUM 10 MG PO TABS: 10.0000 mg | ORAL_TABLET | Freq: Every day | ORAL | 3 refills | Status: DC

## 2020-01-28 NOTE — Telephone Encounter (Signed)
I have faxed script to pharmacy

## 2020-01-28 NOTE — Telephone Encounter (Signed)
Initial Comment Caller states she needs a refill of her cholesterol medication. She has been trying to get the refill for 3 weeks. She sees Dr. Jonni Sanger. Office address verified. She is not having symptoms.

## 2020-04-29 ENCOUNTER — Encounter: Payer: Self-pay | Admitting: Family Medicine

## 2020-04-29 ENCOUNTER — Ambulatory Visit: Payer: BC Managed Care – PPO | Admitting: Family Medicine

## 2020-04-29 ENCOUNTER — Other Ambulatory Visit: Payer: Self-pay

## 2020-04-29 VITALS — BP 170/80 | HR 115 | Temp 98.6°F | Resp 17 | Ht 63.0 in | Wt 186.0 lb

## 2020-04-29 DIAGNOSIS — R002 Palpitations: Secondary | ICD-10-CM | POA: Diagnosis not present

## 2020-04-29 DIAGNOSIS — R000 Tachycardia, unspecified: Secondary | ICD-10-CM

## 2020-04-29 DIAGNOSIS — Z23 Encounter for immunization: Secondary | ICD-10-CM | POA: Diagnosis not present

## 2020-04-29 DIAGNOSIS — E876 Hypokalemia: Secondary | ICD-10-CM

## 2020-04-29 DIAGNOSIS — I10 Essential (primary) hypertension: Secondary | ICD-10-CM | POA: Diagnosis not present

## 2020-04-29 MED ORDER — LOSARTAN POTASSIUM-HCTZ 100-12.5 MG PO TABS
1.0000 | ORAL_TABLET | Freq: Every day | ORAL | 3 refills | Status: DC
Start: 2020-04-29 — End: 2021-07-16

## 2020-04-29 MED ORDER — METOPROLOL SUCCINATE ER 25 MG PO TB24: 25.0000 mg | ORAL_TABLET | Freq: Every day | ORAL | 3 refills | Status: DC

## 2020-04-29 NOTE — Progress Notes (Signed)
Subjective  CC:  Chief Complaint  Patient presents with  . Palpitations    improved since last visit in April, has had several random episodes   . Hypertension    HPI: Carol [name] is a 54 y.o. female who presents to the office today to address the problems listed above in the chief complaint.  Hypertension f/u: Control is good . Pt reports she is doing well. taking medications as instructed, no medication side effects noted, no TIAs, no chest pain on exertion, no dyspnea on exertion, no swelling of ankles.  Her home readings over the last 3 months showed good control with average reading about 120 over 70s.  Heart rates are from 80s to 110.  Denies adverse effects from his BP medications. Compliance with medication is good.   She admits to intermittent palpitations but mostly related to stress.  They are much better than they were prior to starting a low-dose beta-blocker.  No cough, fatigue or shortness of breath   History of hypokalemia: I reviewed lab work dating back a couple of years.  She was hypokalemic on hydrochlorothiazide.  This is been improved on Maxide.  She denies edema or history of significant swelling.  Assessment  1. Palpitations   2. Sinus tachycardia   3. White coat syndrome with diagnosis of hypertension   4. Need for immunization against influenza   5. Hypokalemia      Plan    Hypertension f/u: BP control is well controlled.  Blood pressure by home readings looks great.  She does have significant white coat syndrome.  We will continue with home monitoring.  Will adjust medications as follows: Stop Maxide, change to Hyzaar 100/12.5 daily and increase beta-blocker to 25 mg daily.  Recheck in 3 months and adjust dosage of medicines based on home readings.  We will also need to recheck kidney function and potassium at that visit.  Sinus tachycardia: Improved clinically on beta-blocker.  Increase to full dose 25 mg daily.  Monitor heart rate.  Hypokalemia:  As noted above, will recheck at next visit.  If becomes hypokalemic on low-dose HCTZ in the setting of ACE inhibitor, may warrant further evaluation for hyperaldosteronism.  Flu vaccine updated today.  Education regarding management of these chronic disease states was given. Management strategies discussed on successive visits include dietary and exercise recommendations, goals of achieving and maintaining IBW, and lifestyle modifications aiming for adequate sleep and minimizing stressors.   Follow up: Return in about 4 months (around 08/29/2020) for complete physical.  Orders Placed This Encounter  Procedures  . Flu Vaccine QUAD 36+ mos IM   Meds ordered this encounter  Medications  . metoprolol succinate (TOPROL-XL) 25 MG 24 hr tablet    Sig: Take 1 tablet (25 mg total) by mouth daily.    Dispense:  90 tablet    Refill:  3  . losartan-hydrochlorothiazide (HYZAAR) 100-12.5 MG tablet    Sig: Take 1 tablet by mouth daily.    Dispense:  90 tablet    Refill:  3       BP Readings from Last 3 Encounters:  04/29/20 (!) 170/80  12/13/19 128/72  11/21/19 (!) 160/82   Wt Readings from Last 3 Encounters:  04/29/20 186 lb (84.4 kg)  12/13/19 180 lb 3.2 oz (81.7 kg)  11/21/19 177 lb (80.3 kg)    Lab Results  Component Value Date   CHOL 217 (H) 04/02/2019   CHOL 178 04/13/2018   CHOL 208 (H) 07/11/2017  Lab Results  Component Value Date   HDL 35.80 (L) 04/02/2019   HDL 39.50 04/13/2018   HDL 35 (L) 07/11/2017   Lab Results  Component Value Date   LDLCALC 113 (H) 04/13/2018   LDLCALC 142 (H) 07/11/2017   LDLCALC 128 06/27/2016   Lab Results  Component Value Date   TRIG 222.0 (H) 04/02/2019   TRIG 126.0 04/13/2018   TRIG 155 (H) 07/11/2017   Lab Results  Component Value Date   CHOLHDL 6 04/02/2019   CHOLHDL 4 04/13/2018   CHOLHDL 5.9 (H) 07/11/2017   Lab Results  Component Value Date   LDLDIRECT 144.0 04/02/2019   Lab Results  Component Value Date   CREATININE  1.19 12/13/2019   BUN 15 12/13/2019   NA 137 12/13/2019   K 3.5 12/13/2019   CL 99 12/13/2019   CO2 30 12/13/2019    The 10-year ASCVD risk score Mikey Bussing DC Jr., et al., 2013) is: 20.2%   Values used to calculate the score:     Age: 62 years     Sex: Female     Is Non-Hispanic African American: Yes     Diabetic: No     Tobacco smoker: No     Systolic Blood Pressure: 956 mmHg     Is BP treated: Yes     HDL Cholesterol: 35.8 mg/dL     Total Cholesterol: 217 mg/dL  I reviewed the patients updated PMH, FH, and SocHx.    Patient Active Problem List   Diagnosis Date Noted  . Obesity (BMI 30-39.9) 10/23/2019    Priority: High  . White coat syndrome with diagnosis of hypertension 07/18/2018    Priority: High  . Mixed hyperlipidemia 04/13/2018    Priority: High  . Prediabetes 10/02/2017    Priority: High  . Chronic kidney disease, stage III (moderate) 12/03/2010    Priority: High  . Menopausal vaginal dryness 07/18/2018    Priority: Low  . Sinus tachycardia 12/13/2019  . Hypokalemia 07/18/2018    Allergies: Amlodipine, Doxazosin, Lisinopril, and Spironolactone  Social History: Patient  reports that she has never smoked. She has never used smokeless tobacco. She reports current alcohol use. She reports that she does not use drugs.  Current Meds  Medication Sig  . ALPRAZolam (XANAX) 0.5 MG tablet Take 1 tablet (0.5 mg total) by mouth daily as needed for anxiety.  . cholecalciferol (VITAMIN D) 1000 units tablet Take 1,000 Units by mouth daily.  Marland Kitchen ketoconazole (NIZORAL) 2 % shampoo   . metoprolol succinate (TOPROL-XL) 25 MG 24 hr tablet Take 1 tablet (25 mg total) by mouth daily.  Marland Kitchen nystatin cream (MYCOSTATIN) Apply 1 application topically as needed.  . psyllium (REGULOID) 0.52 g capsule Take 0.52 g by mouth daily.  . rosuvastatin (CRESTOR) 10 MG tablet Take 1 tablet (10 mg total) by mouth daily.  . [DISCONTINUED] losartan (COZAAR) 100 MG tablet Take 1 tablet (100 mg total) by  mouth daily.  . [DISCONTINUED] metoprolol succinate (TOPROL-XL) 25 MG 24 hr tablet Take 0.5 tablets (12.5 mg total) by mouth daily.  . [DISCONTINUED] triamterene-hydrochlorothiazide (MAXZIDE-25) 37.5-25 MG tablet Take 1 tablet by mouth daily.    Review of Systems: Cardiovascular: negative for chest pain, palpitations, leg swelling, orthopnea Respiratory: negative for SOB, wheezing or persistent cough Gastrointestinal: negative for abdominal pain Genitourinary: negative for dysuria or gross hematuria  Objective  Vitals: BP (!) 170/80   Pulse (!) 115   Temp 98.6 F (37 C) (Temporal)   Resp 17  Ht 5\' 3"  (1.6 m)   Wt 186 lb (84.4 kg)   LMP 11/27/2005   SpO2 97%   BMI 32.95 kg/m  General: no acute distress  Psych:  Alert and oriented, normal mood and affect HEENT:  Normocephalic, atraumatic, supple neck  Cardiovascular: Regular sinus tach without murmur. no edema Respiratory:  Good breath sounds bilaterally, CTAB with normal respiratory effort Skin:  Warm, no rashes Neurologic:   Mental status is normal  Commons side effects, risks, benefits, and alternatives for medications and treatment plan prescribed today were discussed, and the patient expressed understanding of the given instructions. Patient is instructed to call or message via MyChart if he/she has any questions or concerns regarding our treatment plan. No barriers to understanding were identified. We discussed Red Flag symptoms and signs in detail. Patient expressed understanding regarding what to do in case of urgent or emergency type symptoms.   Medication list was reconciled, printed and provided to the patient in AVS. Patient instructions and summary information was reviewed with the patient as documented in the AVS. This note was prepared with assistance of Dragon voice recognition software. Occasional wrong-word or sound-a-like substitutions may have occurred due to the inherent limitations of voice recognition  software  This visit occurred during the SARS-CoV-2 public health emergency.  Safety protocols were in place, including screening questions prior to the visit, additional usage of staff PPE, and extensive cleaning of exam room while observing appropriate contact time as indicated for disinfecting solutions.

## 2020-04-29 NOTE — Patient Instructions (Addendum)
Please return in 3-4 months for your annual complete physical; please come fasting. And for blood pressure follow up. Keep a log of your blood pressures and heart rates. This helps me greatly.   Things look good now: let's go to a full tablet daily of the beta blocker, stop the maxzide and start losartan-hct daily.   Great seeing you!  Today you were given your flu vaccination.   If you have any questions or concerns, please don't hesitate to send me a message via MyChart or call the office at [phone]. Thank you for visiting with [place] today! It's our pleasure caring for you.

## 2020-09-18 ENCOUNTER — Encounter: Payer: BC Managed Care – PPO | Admitting: Family Medicine

## 2020-12-10 ENCOUNTER — Encounter: Payer: Self-pay | Admitting: Family Medicine

## 2020-12-10 ENCOUNTER — Ambulatory Visit (INDEPENDENT_AMBULATORY_CARE_PROVIDER_SITE_OTHER): Payer: 59 | Admitting: Family Medicine

## 2020-12-10 ENCOUNTER — Other Ambulatory Visit: Payer: Self-pay

## 2020-12-10 VITALS — BP 140/94 | HR 116 | Temp 98.3°F | Ht 62.0 in | Wt 190.0 lb

## 2020-12-10 DIAGNOSIS — Z0000 Encounter for general adult medical examination without abnormal findings: Secondary | ICD-10-CM | POA: Diagnosis not present

## 2020-12-10 DIAGNOSIS — Z7185 Encounter for immunization safety counseling: Secondary | ICD-10-CM

## 2020-12-10 DIAGNOSIS — R000 Tachycardia, unspecified: Secondary | ICD-10-CM | POA: Diagnosis not present

## 2020-12-10 DIAGNOSIS — E782 Mixed hyperlipidemia: Secondary | ICD-10-CM

## 2020-12-10 DIAGNOSIS — R7303 Prediabetes: Secondary | ICD-10-CM | POA: Diagnosis not present

## 2020-12-10 DIAGNOSIS — I10 Essential (primary) hypertension: Secondary | ICD-10-CM

## 2020-12-10 DIAGNOSIS — E669 Obesity, unspecified: Secondary | ICD-10-CM | POA: Diagnosis not present

## 2020-12-10 DIAGNOSIS — N1831 Chronic kidney disease, stage 3a: Secondary | ICD-10-CM

## 2020-12-10 DIAGNOSIS — J301 Allergic rhinitis due to pollen: Secondary | ICD-10-CM

## 2020-12-10 LAB — HEMOGLOBIN A1C: Hgb A1c MFr Bld: 6.6 % — ABNORMAL HIGH (ref 4.6–6.5)

## 2020-12-10 LAB — CBC WITH DIFFERENTIAL/PLATELET
Basophils Absolute: 0 10*3/uL (ref 0.0–0.1)
Basophils Relative: 0.3 % (ref 0.0–3.0)
Eosinophils Absolute: 0.1 10*3/uL (ref 0.0–0.7)
Eosinophils Relative: 1.8 % (ref 0.0–5.0)
HCT: 37.8 % (ref 36.0–46.0)
Hemoglobin: 12.2 g/dL (ref 12.0–15.0)
Lymphocytes Relative: 23.2 % (ref 12.0–46.0)
Lymphs Abs: 1.9 10*3/uL (ref 0.7–4.0)
MCHC: 32.1 g/dL (ref 30.0–36.0)
MCV: 73.1 fl — ABNORMAL LOW (ref 78.0–100.0)
Monocytes Absolute: 0.5 10*3/uL (ref 0.1–1.0)
Monocytes Relative: 6.1 % (ref 3.0–12.0)
Neutro Abs: 5.5 10*3/uL (ref 1.4–7.7)
Neutrophils Relative %: 68.6 % (ref 43.0–77.0)
Platelets: 327 10*3/uL (ref 150.0–400.0)
RBC: 5.18 Mil/uL — ABNORMAL HIGH (ref 3.87–5.11)
RDW: 15.3 % (ref 11.5–15.5)
WBC: 8.1 10*3/uL (ref 4.0–10.5)

## 2020-12-10 LAB — COMPREHENSIVE METABOLIC PANEL
ALT: 21 U/L (ref 0–35)
AST: 16 U/L (ref 0–37)
Albumin: 3.6 g/dL (ref 3.5–5.2)
Alkaline Phosphatase: 79 U/L (ref 39–117)
BUN: 13 mg/dL (ref 6–23)
CO2: 30 mEq/L (ref 19–32)
Calcium: 9.5 mg/dL (ref 8.4–10.5)
Chloride: 103 mEq/L (ref 96–112)
Creatinine, Ser: 1.11 mg/dL (ref 0.40–1.20)
GFR: 56.55 mL/min — ABNORMAL LOW (ref >60.00)
Glucose, Bld: 100 mg/dL — ABNORMAL HIGH (ref 70–99)
Potassium: 3.3 mEq/L — ABNORMAL LOW (ref 3.5–5.1)
Sodium: 139 mEq/L (ref 135–145)
Total Bilirubin: 0.3 mg/dL (ref 0.2–1.2)
Total Protein: 7.2 g/dL (ref 6.0–8.3)

## 2020-12-10 LAB — LIPID PANEL
Cholesterol: 136 mg/dL (ref 0–200)
HDL: 35.7 mg/dL — ABNORMAL LOW (ref >39.00)
LDL Cholesterol: 83 mg/dL (ref 0–99)
NonHDL: 100.03
Total CHOL/HDL Ratio: 4
Triglycerides: 83 mg/dL (ref 0.0–149.0)
VLDL: 16.6 mg/dL (ref 0.0–40.0)

## 2020-12-10 LAB — TSH: TSH: 2.44 u[IU]/mL (ref 0.35–4.50)

## 2020-12-10 MED ORDER — METOPROLOL SUCCINATE ER 50 MG PO TB24: 50.0000 mg | ORAL_TABLET | Freq: Every day | ORAL | 3 refills | Status: DC

## 2020-12-10 NOTE — Progress Notes (Signed)
Subjective  Chief Complaint  Patient presents with  . Annual Exam    Fasting, cramping in heads and feet - thinks it might be low potassium   . Wheezing    Thinks this is caused my her allergies     HPI: Genesia Priyanka Causey is a 55 y.o. female who presents to [place] [facility] [place] today for a Female Wellness Visit. She also has the concerns and/or needs as listed above in the chief complaint. These will be addressed in addition to the Health Maintenance Visit.   Wellness Visit: annual visit with health maintenance review and exam without Pap   Health maintenance: Patient will schedule to see her gynecologist and update her mammogram which is due.  Pap smear is up-to-date.  Overall, feeling physically well.  Diet could use improvement.  Eats out regularly.  Little exercise.  Eligible for Shingrix vaccination.  Colorectal cancer screening is up-to-date and normal. Chronic disease f/u and/or acute problem visit: (deemed necessary to be done in addition to the wellness visit):  Hypertension f/u: Control is poor. Pt reports she is doing well. taking medications as instructed, no medication side effects noted, no TIAs, no chest pain on exertion, no dyspnea on exertion, no swelling of ankles.  Does check her blood pressures periodically and they run 130s to 140s over 80s to low 90s.  She definitely knows they are higher when she is stressed.  She admits she has dealing with increased stress due to a possible job change and her daughter relocating to [place].  Denies adverse effects from his BP medications. Compliance with medication is good.   Palpitations with sinus tachycardia: Much improved on metoprolol.  No adverse effects.  Hyperlipidemia tolerating her statin well.  Compliance has improved.  Diet remains fair.  History of prediabetes: Denies symptoms of hypoglycemia.  Weight is up 4 pounds from last year.  Allergies are active with nasal congestion, postnasal drip,  sneezing and intermittent wheeze.  She is not currently taking any medications.  No history of asthma but does have bronchospasm with bronchitis.  Chronic kidney disease managed by nephrology.  No lower extremity edema, abdominal pain, nausea Review of systems: Positive for mild decreased motivation and stress.  Depression screen [facility] 2/9 12/10/2020 04/02/2019 02/27/2019 04/13/2018 08/04/2017  Decreased Interest 0 0 0 0 0  Down, Depressed, Hopeless 0 1 0 0 0  PHQ - 2 Score 0 1 0 0 0  Altered sleeping - 1 0 - -  Tired, decreased energy - 0 0 - -  Change in appetite - 0 1 - -  Feeling bad or failure about yourself  - 0 0 - -  Trouble concentrating - 0 0 - -  Moving slowly or fidgety/restless - 0 0 - -  Suicidal thoughts - 0 0 - -  PHQ-9 Score - 2 1 - -  Difficult doing work/chores - Not difficult at all Not difficult at all - -    BP Readings from Last 3 Encounters:  12/10/20 (!) 140/94  04/29/20 (!) 170/80  12/13/19 128/72   Wt Readings from Last 3 Encounters:  12/10/20 190 lb (86.2 kg)  04/29/20 186 lb (84.4 kg)  12/13/19 180 lb 3.2 oz (81.7 kg)    Lab Results  Component Value Date   CHOL 217 (H) 04/02/2019   CHOL 178 04/13/2018   CHOL 208 (H) 07/11/2017   Lab Results  Component Value Date   HDL 35.80 (L) 04/02/2019   HDL 39.50  04/13/2018   HDL 35 (L) 07/11/2017   Lab Results  Component Value Date   LDLCALC 113 (H) 04/13/2018   LDLCALC 142 (H) 07/11/2017   LDLCALC 128 06/27/2016   Lab Results  Component Value Date   TRIG 222.0 (H) 04/02/2019   TRIG 126.0 04/13/2018   TRIG 155 (H) 07/11/2017   Lab Results  Component Value Date   CHOLHDL 6 04/02/2019   CHOLHDL 4 04/13/2018   CHOLHDL 5.9 (H) 07/11/2017   Lab Results  Component Value Date   LDLDIRECT 144.0 04/02/2019   Lab Results  Component Value Date   CREATININE 1.19 12/13/2019   BUN 15 12/13/2019   NA 137 12/13/2019   K 3.5 12/13/2019   CL 99 12/13/2019   CO2 30 12/13/2019    The 10-year ASCVD risk  score Mikey Bussing DC Jr., et al., 2013) is: 10.5%   Values used to calculate the score:     Age: 76 years     Sex: Female     Is Non-Hispanic African American: Yes     Diabetic: No     Tobacco smoker: No     Systolic Blood Pressure: 295 mmHg     Is BP treated: Yes     HDL Cholesterol: 35.8 mg/dL     Total Cholesterol: 217 mg/dL   Assessment  1. Annual physical exam   2. White coat syndrome with diagnosis of hypertension   3. Stage 3a chronic kidney disease ([place])   4. Sinus tachycardia   5. Mixed hyperlipidemia   6. Prediabetes   7. Obesity (BMI 30-39.9)   8. Seasonal allergic rhinitis due to pollen   9. Vaccine counseling      Plan  Female Wellness Visit:  Age appropriate Health Maintenance and Prevention measures were discussed with patient. Included topics are cancer screening recommendations, ways to keep healthy (see AVS) including dietary and exercise recommendations, regular eye and dental care, use of seat belts, and avoidance of moderate alcohol use and tobacco use.  Patient will schedule with GYN to update her mammogram  BMI: discussed patient's BMI and encouraged positive lifestyle modifications to help get to or maintain a target BMI.  HM needs and immunizations were addressed and ordered. See below for orders. See HM and immunization section for updates.  Counseling done on the Shingrix vaccination.  She will consider.   Routine labs and screening tests ordered including cmp, cbc and lipids where appropriate.  Discussed recommendations regarding Vit D and calcium supplementation (see AVS)  Chronic disease management visit and/or acute problem visit:  Hypertension: Fair control, does have whitecoat component but elevated home readings.  Increase metoprolol XL to 50 mg daily.  Continue losartan 100 daily.  Monitor renal function and electrolytes.  Hyperlipidemia: On Crestor nightly.  Recheck fasting levels.  Goal LDL less than 100.  Stage III chronic kidney disease:  Clinically stable.  Monitor.  Sinus tach with palpitations managed with metoprolol.  History of prediabetes.  Continue to monitor.  Discussed healthy diet and low-fat diet and weight loss.  Mood: Counseling done.  Monitor  Seasonal allergies: Start Zyrtec   Follow up: 6 months for recheck hypertension, sooner if needed for mood or anxiety Orders Placed This Encounter  Procedures  . CBC with Differential/Platelet  . Comprehensive metabolic panel  . Lipid panel  . TSH  . Hemoglobin A1c  . Hepatitis C antibody  . HIV Antibody (routine testing w rflx)   Meds ordered this encounter  Medications  . metoprolol succinate (  TOPROL-XL) 50 MG 24 hr tablet    Sig: Take 1 tablet (50 mg total) by mouth daily. Take with or immediately following a meal.    Dispense:  90 tablet    Refill:  3      Body mass index is 34.75 kg/m. Wt Readings from Last 3 Encounters:  12/10/20 190 lb (86.2 kg)  04/29/20 186 lb (84.4 kg)  12/13/19 180 lb 3.2 oz (81.7 kg)     Patient Active Problem List   Diagnosis Date Noted  . Obesity (BMI 30-39.9) 10/23/2019    Priority: High  . White coat syndrome with diagnosis of hypertension 07/18/2018    Priority: High  . Mixed hyperlipidemia 04/13/2018    Priority: High  . Prediabetes 10/02/2017    Priority: High  . Chronic kidney disease, stage III (moderate) ([place]) 12/03/2010    Priority: High    [facility] by 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   . Sinus tachycardia 12/13/2019    Priority: Medium  . Seasonal allergic rhinitis due to pollen 12/10/2020    Priority: Low  . Menopausal vaginal dryness 07/18/2018    Priority: Low  . Hypokalemia 07/18/2018   Health Maintenance  Topic Date Due  . Hepatitis C Screening  Never done  . HIV Screening  Never done  . MAMMOGRAM  11/20/2020  . INFLUENZA VACCINE  03/29/2021  . TETANUS/TDAP  06/23/2025  . COLONOSCOPY (Pts 45-68yrs Insurance coverage will need to be confirmed)  09/19/2027  . COVID-19  Vaccine  Completed  . HPV VACCINES  Aged Out   Immunization History  Administered Date(s) Administered  . Influenza Whole 05/31/2018  . Influenza,inj,Quad PF,6+ Mos 07/10/2015, 06/13/2017, 06/13/2018, 06/04/2019, 04/29/2020  . Influenza-Unspecified 06/13/2017  . PFIZER(Purple Top)SARS-COV-2 Vaccination 12/14/2019, 01/04/2020, 08/28/2020  . Tdap 06/24/2015   We updated and reviewed the patient's past history in detail and it is documented below. Allergies: Patient is allergic to amlodipine, doxazosin, lisinopril, and spironolactone. Past Medical History Patient  has a past medical history of Allergy, Anemia, Arthritis, Chicken pox, GERD (gastroesophageal reflux disease), Hay fever, High cholesterol, Hypertension, Kidney disease (2011), and Obesity (BMI 30-39.9) (10/23/2019). Past Surgical History Patient  has a past surgical history that includes Cesarean section and Abdominal hysterectomy (2007). Family History: Patient family history includes Colon polyps in her mother; Depression in her paternal grandfather; Heart attack in her maternal grandfather; High Cholesterol in her brother and mother; Hypertension in her father, maternal grandmother, mother, and sister; Kidney disease in her brother, mother, and paternal grandfather. Social History:  Patient  reports that she has never smoked. She has never used smokeless tobacco. She reports current alcohol use. She reports that she does not use drugs.  Review of Systems: Constitutional: negative for fever or malaise Ophthalmic: negative for photophobia, double vision or loss of vision Cardiovascular: negative for chest pain, dyspnea on exertion, or new LE swelling Respiratory: negative for SOB or persistent cough Gastrointestinal: negative for abdominal pain, change in bowel habits or melena Genitourinary: negative for dysuria or gross hematuria, no abnormal uterine bleeding or disharge Musculoskeletal: negative for new gait disturbance or  muscular weakness Integumentary: negative for new or persistent rashes, no breast lumps Neurological: negative for TIA or stroke symptoms Psychiatric: negative for SI or delusions Allergic/Immunologic: negative for hives  Patient Care Team    Relationship Specialty Notifications Start End  Leamon Arnt, MD PCP - General Family Medicine  10/23/19   Jolyn Lent, MD Consulting Physician Nephrology  07/17/18   Megan Salon,  MD Consulting Physician Gynecology  07/17/18   Gatha Mayer, MD Consulting Physician Gastroenterology  10/23/19     Objective  Vitals: BP (!) 140/94   Pulse (!) 116   Temp 98.3 F (36.8 C) (Temporal)   Ht 5\' 2"  (1.575 m)   Wt 190 lb (86.2 kg)   LMP 11/27/2005   SpO2 98%   BMI 34.75 kg/m  General:  Well developed, well nourished, no acute distress  Psych:  Alert and orientedx3,normal mood and affect HEENT:  Normocephalic, atraumatic, non-icteric sclera,  supple neck without adenopathy, mass or thyromegaly Cardiovascular:  Normal S1, S2, RRR without gallop, rub or murmur Respiratory:  Good breath sounds bilaterally, CTAB with normal respiratory effort Gastrointestinal: normal bowel sounds, soft, non-tender, no noted masses. No HSM MSK: no deformities, contusions. Joints are without erythema or swelling.  Skin:  Warm, no rashes or suspicious lesions noted, multiple skin tags on neck axilla and breasts Neurologic:    Mental status is normal. CN 2-11 are normal. Gross motor and sensory exams are normal. Normal gait. No tremor Breast Exam: No mass, skin retraction or nipple discharge is appreciated in either breast. No axillary adenopathy. Fibrocystic changes are not noted   Commons side effects, risks, benefits, and alternatives for medications and treatment plan prescribed today were discussed, and the patient expressed understanding of the given instructions. Patient is instructed to call or message via MyChart if he/she has any questions or concerns  regarding our treatment plan. No barriers to understanding were identified. We discussed Red Flag symptoms and signs in detail. Patient expressed understanding regarding what to do in case of urgent or emergency type symptoms.   Medication list was reconciled, printed and provided to the patient in AVS. Patient instructions and summary information was reviewed with the patient as documented in the AVS. This note was prepared with assistance of Dragon voice recognition software. Occasional wrong-word or sound-a-like substitutions may have occurred due to the inherent limitations of voice recognition software  This visit occurred during the SARS-CoV-2 public health emergency.  Safety protocols were in place, including screening questions prior to the visit, additional usage of staff PPE, and extensive cleaning of exam room while observing appropriate contact time as indicated for disinfecting solutions.

## 2020-12-10 NOTE — Patient Instructions (Addendum)
Please return in 6 months for hypertension follow up. Please schedule with Dr. Sabra Heck to update your mammogram   I will release your lab results to you on your MyChart account with further instructions. Please reply with any questions.  Start otc zyrtec nightly for your allergies.   If you have any questions or concerns, please don't hesitate to send me a message via MyChart or call the office at [phone]. Thank you for visiting with [place] today! It's our pleasure caring for you.   Fat and Cholesterol Restricted Eating Plan Getting too much fat and cholesterol in your diet may cause health problems. Choosing the right foods helps keep your fat and cholesterol at normal levels. This can keep you from getting certain diseases. Your doctor may recommend an eating plan that includes:  Total fat: ______% or less of total calories a day.  Saturated fat: ______% or less of total calories a day.  Cholesterol: less than _________mg a day.  Fiber: ______g a day. What are tips for following this plan? Meal planning  At meals, divide your plate into four equal parts: ? Fill one-half of your plate with vegetables and green salads. ? Fill one-fourth of your plate with whole grains. ? Fill one-fourth of your plate with low-fat (lean) protein foods.  Eat fish that is high in omega-3 fats at least two times a week. This includes mackerel, tuna, sardines, and salmon.  Eat foods that are high in fiber, such as whole grains, beans, apples, broccoli, carrots, peas, and barley. General tips  Work with your doctor to lose weight if you need to.  Avoid: ? Foods with added sugar. ? Fried foods. ? Foods with partially hydrogenated oils.  Limit alcohol intake to no more than 1 drink a day for nonpregnant women and 2 drinks a day for men. One drink equals 12 oz of beer, 5 oz of wine, or 1 oz of hard liquor.   Reading food labels  Check food labels for: ? Trans fats. ? Partially hydrogenated  oils. ? Saturated fat (g) in each serving. ? Cholesterol (mg) in each serving. ? Fiber (g) in each serving.  Choose foods with healthy fats, such as: ? Monounsaturated fats. ? Polyunsaturated fats. ? Omega-3 fats.  Choose grain products that have whole grains. Look for the word "whole" as the first word in the ingredient list. Cooking  Cook foods using low-fat methods. These include baking, boiling, grilling, and broiling.  Eat more home-cooked foods. Eat at restaurants and buffets less often.  Avoid cooking using saturated fats, such as butter, cream, palm oil, palm kernel oil, and coconut oil. Recommended foods Fruits  All fresh, canned (in natural juice), or frozen fruits. Vegetables  Fresh or frozen vegetables (raw, steamed, roasted, or grilled). Green salads. Grains  Whole grains, such as whole wheat or whole grain breads, crackers, cereals, and pasta. Unsweetened oatmeal, bulgur, barley, quinoa, or brown rice. Corn or whole wheat flour tortillas. Meats and other protein foods  Ground beef (85% or leaner), grass-fed beef, or beef trimmed of fat. Skinless chicken or [place]. Ground chicken or [place]. Pork trimmed of fat. All fish and seafood. Egg whites. Dried beans, peas, or lentils. Unsalted nuts or seeds. Unsalted canned beans. Nut butters without added sugar or oil. Dairy  Low-fat or nonfat dairy products, such as skim or 1% milk, 2% or reduced-fat cheeses, low-fat and fat-free ricotta or cottage cheese, or plain low-fat and nonfat yogurt. Fats and oils  Tub margarine without trans fats.  Light or reduced-fat mayonnaise and salad dressings. Avocado. Olive, canola, sesame, or safflower oils. The items listed above may not be a complete list of foods and beverages you can eat. Contact a dietitian for more information.   Foods to avoid Fruits  Canned fruit in heavy syrup. Fruit in cream or butter sauce. Fried fruit. Vegetables  Vegetables cooked in cheese, cream, or  butter sauce. Fried vegetables. Grains  White bread. White pasta. White rice. Cornbread. Bagels, pastries, and croissants. Crackers and snack foods that contain trans fat and hydrogenated oils. Meats and other protein foods  Fatty cuts of meat. Ribs, chicken wings, bacon, sausage, bologna, salami, chitterlings, fatback, hot dogs, bratwurst, and packaged lunch meats. Liver and organ meats. Whole eggs and egg yolks. Chicken and [place] with skin. Fried meat. Dairy  Whole or 2% milk, cream, half-and-half, and cream cheese. Whole milk cheeses. Whole-fat or sweetened yogurt. Full-fat cheeses. Nondairy creamers and whipped toppings. Processed cheese, cheese spreads, and cheese curds. Beverages  Alcohol. Sugar-sweetened drinks such as sodas, lemonade, and fruit drinks. Fats and oils  Butter, stick margarine, lard, shortening, ghee, or bacon fat. Coconut, palm kernel, and palm oils. Sweets and desserts  Corn syrup, sugars, honey, and molasses. Candy. Jam and jelly. Syrup. Sweetened cereals. Cookies, pies, cakes, donuts, muffins, and ice cream. The items listed above may not be a complete list of foods and beverages you should avoid. Contact a dietitian for more information. Summary  Choosing the right foods helps keep your fat and cholesterol at normal levels. This can keep you from getting certain diseases.  At meals, fill one-half of your plate with vegetables and green salads.  Eat high-fiber foods, like whole grains, beans, apples, carrots, peas, and barley.  Limit added sugar, saturated fats, alcohol, and fried foods. This information is not intended to replace advice given to you by your health care provider. Make sure you discuss any questions you have with your health care provider. Document Revised: 12/18/2019 Document Reviewed: 12/18/2019 Elsevier Patient Education  [employer].   Exercise to Lose Weight Exercise and a healthy diet may help you lose weight. Your doctor may  suggest specific exercises. EXERCISE IDEAS AND TIPS  Choose low-cost things you enjoy doing, such as walking, bicycling, or exercising to workout videos.  Take stairs instead of the elevator.  Walk during your lunch break.  Park your car further away from work or school.  Go to a gym or an exercise class.  Start with 5 to 10 minutes of exercise each day. Build up to 30 minutes of exercise 4 to 6 days a week.  Wear shoes with good support and comfortable clothes.  Stretch before and after working out.  Work out until you breathe harder and your heart beats faster.  Drink extra water when you exercise.  Do not do so much that you hurt yourself, feel dizzy, or get very short of breath. Exercises that burn about 150 calories:  Running 1  miles in 15 minutes.  Playing volleyball for 45 to 60 minutes.  Washing and waxing a car for 45 to 60 minutes.  Playing touch football for 45 minutes.  Walking 1  miles in 35 minutes.  Pushing a stroller 1  miles in 30 minutes.  Playing basketball for 30 minutes.  Raking leaves for 30 minutes.  Bicycling 5 miles in 30 minutes.  Walking 2 miles in 30 minutes.  Dancing for 30 minutes.  Shoveling snow for 15 minutes.  Swimming laps for 20  minutes.  Walking up stairs for 15 minutes.  Bicycling 4 miles in 15 minutes.  Gardening for 30 to 45 minutes.  Jumping rope for 15 minutes.  Washing windows or floors for 45 to 60 minutes.

## 2020-12-11 LAB — HIV ANTIBODY (ROUTINE TESTING W REFLEX): HIV 1&2 Ab, 4th Generation: NONREACTIVE

## 2020-12-11 LAB — HEPATITIS C ANTIBODY
Hepatitis C Ab: NONREACTIVE
SIGNAL TO CUT-OFF: 0.01 (ref <1.00)

## 2020-12-23 ENCOUNTER — Other Ambulatory Visit: Payer: Self-pay

## 2020-12-23 MED ORDER — POTASSIUM CHLORIDE CRYS ER 10 MEQ PO TBCR: 10.0000 meq | EXTENDED_RELEASE_TABLET | Freq: Once | ORAL | 3 refills | Status: DC

## 2020-12-24 ENCOUNTER — Other Ambulatory Visit: Payer: Self-pay

## 2020-12-24 MED ORDER — POTASSIUM CHLORIDE CRYS ER 10 MEQ PO TBCR: 10.0000 meq | EXTENDED_RELEASE_TABLET | Freq: Every day | ORAL | 3 refills | Status: DC

## 2021-01-04 ENCOUNTER — Other Ambulatory Visit: Payer: Self-pay | Admitting: Obstetrics & Gynecology

## 2021-01-04 DIAGNOSIS — Z1231 Encounter for screening mammogram for malignant neoplasm of breast: Secondary | ICD-10-CM

## 2021-02-19 ENCOUNTER — Other Ambulatory Visit: Payer: Self-pay | Admitting: Family Medicine

## 2021-02-19 DIAGNOSIS — E782 Mixed hyperlipidemia: Secondary | ICD-10-CM

## 2021-02-26 ENCOUNTER — Ambulatory Visit: Payer: 59

## 2021-03-15 ENCOUNTER — Ambulatory Visit: Payer: 59 | Admitting: Family Medicine

## 2021-03-18 ENCOUNTER — Encounter: Payer: Self-pay | Admitting: Family

## 2021-03-18 ENCOUNTER — Other Ambulatory Visit: Payer: Self-pay

## 2021-03-18 ENCOUNTER — Ambulatory Visit (INDEPENDENT_AMBULATORY_CARE_PROVIDER_SITE_OTHER): Payer: 59 | Admitting: Family

## 2021-03-18 VITALS — BP 153/86 | HR 116 | Temp 98.4°F | Ht 62.0 in | Wt 194.0 lb

## 2021-03-18 DIAGNOSIS — N390 Urinary tract infection, site not specified: Secondary | ICD-10-CM

## 2021-03-18 DIAGNOSIS — R300 Dysuria: Secondary | ICD-10-CM

## 2021-03-18 LAB — POCT URINALYSIS DIPSTICK
Bilirubin, UA: NEGATIVE
Blood, UA: POSITIVE
Glucose, UA: NEGATIVE
Ketones, UA: NEGATIVE
Leukocytes, UA: NEGATIVE
Nitrite, UA: NEGATIVE
Protein, UA: POSITIVE — AB
Spec Grav, UA: 1.015 (ref 1.010–1.025)
Urobilinogen, UA: 0.2 E.U./dL
pH, UA: 6.5 (ref 5.0–8.0)

## 2021-03-18 MED ORDER — NITROFURANTOIN MONOHYD MACRO 100 MG PO CAPS: 100.0000 mg | ORAL_CAPSULE | Freq: Two times a day (BID) | ORAL | 0 refills | Status: DC

## 2021-03-18 NOTE — Progress Notes (Signed)
Acute Office Visit  Subjective:    Patient ID: Carol [name], female    DOB: 1966/03/02, 55 y.o.   MRN: [id number]  Chief Complaint  Patient presents with  . Urinary Tract Infection    HPI Patient is in today with complaints of blood when wiping, urinary frequency, urgency, and burning with urination x1 day.  She has mild abdominal and back pain.  She has been sexually active recently.  No concerns of STDs.  Denies any fevers or chills.  Past Medical History:  Diagnosis Date  . Allergy   . Anemia   . Arthritis   . Chicken pox   . GERD (gastroesophageal reflux disease)   . Hay fever   . High cholesterol   . Hypertension   . Kidney disease 2011  . Obesity (BMI 30-39.9) 10/23/2019    Past Surgical History:  Procedure Laterality Date  . ABDOMINAL HYSTERECTOMY  2007  . CESAREAN SECTION      Family History  Problem Relation Age of Onset  . Hypertension Mother   . High Cholesterol Mother   . Kidney disease Mother   . Colon polyps Mother   . Hypertension Father   . Hypertension Sister   . High Cholesterol Brother   . Kidney disease Brother   . Hypertension Maternal Grandmother   . Heart attack Maternal Grandfather   . Depression Paternal Grandfather   . Kidney disease Paternal Grandfather   . Colon cancer Neg Hx   . Esophageal cancer Neg Hx   . Rectal cancer Neg Hx   . Stomach cancer Neg Hx     Social History   Socioeconomic History  . Marital status: Married    Spouse name: Not on file  . Number of children: Not on file  . Years of education: Not on file  . Highest education level: Not on file  Occupational History    Employer: [employer]  Tobacco Use  . Smoking status: Never  . Smokeless tobacco: Never  Vaping Use  . Vaping Use: Never used  Substance and Sexual Activity  . Alcohol use: Yes    Comment: occasionally  . Drug use: No  . Sexual activity: Yes    Partners: Male    Birth control/protection: Surgical  Other Topics  Concern  . Not on file  Social History Narrative  . Not on file   Social Determinants of Health   Financial Resource Strain: Not on file  Food Insecurity: Not on file  Transportation Needs: Not on file  Physical Activity: Not on file  Stress: Not on file  Social Connections: Not on file  Intimate Partner Violence: Not on file    Outpatient Medications Prior to Visit  Medication Sig Dispense Refill  . ALPRAZolam (XANAX) 0.5 MG tablet Take 1 tablet (0.5 mg total) by mouth daily as needed for anxiety. 20 tablet 0  . cholecalciferol (VITAMIN D) 1000 units tablet Take 1,000 Units by mouth daily.    Marland Kitchen ketoconazole (NIZORAL) 2 % shampoo     . losartan-hydrochlorothiazide (HYZAAR) 100-12.5 MG tablet Take 1 tablet by mouth daily. 90 tablet 3  . metoprolol succinate (TOPROL-XL) 50 MG 24 hr tablet Take 1 tablet (50 mg total) by mouth daily. Take with or immediately following a meal. 90 tablet 3  . nystatin cream (MYCOSTATIN) Apply 1 application topically as needed.    . potassium chloride (KLOR-CON) 10 MEQ tablet Take 1 tablet (10 mEq total) by mouth daily. 90 tablet 3  .  rosuvastatin (CRESTOR) 10 MG tablet TAKE ONE TABLET BY MOUTH DAILY 90 tablet 0   No facility-administered medications prior to visit.    Allergies  Allergen Reactions  . Amlodipine     Other reaction(s): OTHER  . Doxazosin     Other reaction(s): OTHER  . Lisinopril Other (See Comments)    unknown  . Spironolactone Other (See Comments)    Edema    Review of Systems  Genitourinary:  Positive for dysuria, flank pain, frequency, hematuria and urgency. Negative for vaginal bleeding and vaginal discharge.  All other systems reviewed and are negative.     Objective:    Physical Exam Vitals and nursing note reviewed.  Constitutional:      Appearance: Normal appearance.  Cardiovascular:     Rate and Rhythm: Normal rate and regular rhythm.  Pulmonary:     Effort: Pulmonary effort is normal.     Breath sounds:  Normal breath sounds.  Abdominal:     General: Abdomen is flat. Bowel sounds are normal.     Palpations: Abdomen is soft.     Tenderness: There is abdominal tenderness. There is no right CVA tenderness, left CVA tenderness, guarding or rebound.     Comments: Mild tenderness in the pelvic area.  Musculoskeletal:     Cervical back: Normal range of motion and neck supple.  Skin:    General: Skin is warm and dry.  Neurological:     General: No focal deficit present.     Mental Status: She is alert.  Psychiatric:        Mood and Affect: Mood normal.        Behavior: Behavior normal.   BP (!) 153/86   Pulse (!) 116   Temp 98.4 F (36.9 C) (Temporal)   Ht 5\' 2"  (1.575 m)   Wt 194 lb (88 kg)   LMP 11/27/2005   SpO2 100%   BMI 35.48 kg/m  Wt Readings from Last 3 Encounters:  03/18/21 194 lb (88 kg)  12/10/20 190 lb (86.2 kg)  04/29/20 186 lb (84.4 kg)    Health Maintenance Due  Topic Date Due  . Zoster Vaccines- Shingrix (1 of 2) Never done  . MAMMOGRAM  11/20/2020  . COVID-19 Vaccine (4 - Booster for Pfizer series) 12/26/2020    There are no preventive care reminders to display for this patient.   Lab Results  Component Value Date   TSH 2.44 12/10/2020   Lab Results  Component Value Date   WBC 8.1 12/10/2020   HGB 12.2 12/10/2020   HCT 37.8 12/10/2020   MCV 73.1 (L) 12/10/2020   PLT 327.0 12/10/2020   Lab Results  Component Value Date   NA 139 12/10/2020   K 3.3 (L) 12/10/2020   CO2 30 12/10/2020   GLUCOSE 100 (H) 12/10/2020   BUN 13 12/10/2020   CREATININE 1.11 12/10/2020   BILITOT 0.3 12/10/2020   ALKPHOS 79 12/10/2020   AST 16 12/10/2020   ALT 21 12/10/2020   PROT 7.2 12/10/2020   ALBUMIN 3.6 12/10/2020   CALCIUM 9.5 12/10/2020   GFR 56.55 (L) 12/10/2020   Lab Results  Component Value Date   CHOL 136 12/10/2020   Lab Results  Component Value Date   HDL 35.70 (L) 12/10/2020   Lab Results  Component Value Date   LDLCALC 83 12/10/2020   Lab  Results  Component Value Date   TRIG 83.0 12/10/2020   Lab Results  Component Value Date   CHOLHDL 4  12/10/2020   Lab Results  Component Value Date   HGBA1C 6.6 (H) 12/10/2020       Assessment & Plan:   Problem List Items Addressed This Visit   None Visit Diagnoses     Urinary tract infection without hematuria, site unspecified    -  Primary   Relevant Medications   nitrofurantoin, macrocrystal-monohydrate, (MACROBID) 100 MG capsule   Other Relevant Orders   POCT Urinalysis Dipstick (Completed)   Urine Culture   Dysuria            Meds ordered this encounter  Medications  . nitrofurantoin, macrocrystal-monohydrate, (MACROBID) 100 MG capsule    Sig: Take 1 capsule (100 mg total) by mouth 2 (two) times daily.    Dispense:  10 capsule    Refill:  0   Plan: Void after intercourse.  Drink plenty of fluids.  Cranberry juice.  Avoid dark drinks.  Call the office if symptoms worsen or persist  Kennyth Arnold, FNP

## 2021-03-20 LAB — URINE CULTURE
MICRO NUMBER:: 12147371
SPECIMEN QUALITY:: ADEQUATE

## 2021-04-14 ENCOUNTER — Other Ambulatory Visit: Payer: Self-pay

## 2021-04-14 ENCOUNTER — Ambulatory Visit
Admission: RE | Admit: 2021-04-14 | Discharge: 2021-04-14 | Disposition: A | Discharge status: Home / Self Care | Payer: 59 | Source: Ambulatory Visit | Attending: Obstetrics & Gynecology | Admitting: Obstetrics & Gynecology

## 2021-04-14 DIAGNOSIS — Z1231 Encounter for screening mammogram for malignant neoplasm of breast: Secondary | ICD-10-CM

## 2021-04-19 ENCOUNTER — Ambulatory Visit: Payer: 59 | Admitting: Family Medicine

## 2021-05-04 ENCOUNTER — Ambulatory Visit: Payer: 59 | Admitting: Family Medicine

## 2021-05-17 ENCOUNTER — Encounter: Payer: Self-pay | Admitting: Family Medicine

## 2021-05-17 ENCOUNTER — Telehealth: Payer: Self-pay

## 2021-05-17 ENCOUNTER — Ambulatory Visit (INDEPENDENT_AMBULATORY_CARE_PROVIDER_SITE_OTHER): Payer: 59 | Admitting: Family Medicine

## 2021-05-17 ENCOUNTER — Other Ambulatory Visit: Payer: Self-pay

## 2021-05-17 VITALS — BP 170/90 | HR 96 | Temp 98.2°F | Ht 62.0 in | Wt 193.4 lb

## 2021-05-17 DIAGNOSIS — E782 Mixed hyperlipidemia: Secondary | ICD-10-CM

## 2021-05-17 DIAGNOSIS — E669 Obesity, unspecified: Secondary | ICD-10-CM

## 2021-05-17 DIAGNOSIS — E1169 Type 2 diabetes mellitus with other specified complication: Secondary | ICD-10-CM | POA: Diagnosis not present

## 2021-05-17 DIAGNOSIS — E876 Hypokalemia: Secondary | ICD-10-CM | POA: Diagnosis not present

## 2021-05-17 DIAGNOSIS — R7303 Prediabetes: Secondary | ICD-10-CM | POA: Diagnosis not present

## 2021-05-17 DIAGNOSIS — N1831 Chronic kidney disease, stage 3a: Secondary | ICD-10-CM | POA: Diagnosis not present

## 2021-05-17 DIAGNOSIS — R000 Tachycardia, unspecified: Secondary | ICD-10-CM

## 2021-05-17 DIAGNOSIS — I10 Essential (primary) hypertension: Secondary | ICD-10-CM | POA: Diagnosis not present

## 2021-05-17 LAB — BASIC METABOLIC PANEL
BUN: 13 mg/dL (ref 6–23)
CO2: 30 mEq/L (ref 19–32)
Calcium: 9.6 mg/dL (ref 8.4–10.5)
Chloride: 101 mEq/L (ref 96–112)
Creatinine, Ser: 1.11 mg/dL (ref 0.40–1.20)
GFR: 56.38 mL/min — ABNORMAL LOW (ref >60.00)
Glucose, Bld: 105 mg/dL — ABNORMAL HIGH (ref 70–99)
Potassium: 3.6 mEq/L (ref 3.5–5.1)
Sodium: 139 mEq/L (ref 135–145)

## 2021-05-17 LAB — POCT GLYCOSYLATED HEMOGLOBIN (HGB A1C): Hemoglobin A1C: 6.3 % — AB (ref 4.0–5.6)

## 2021-05-17 MED ORDER — METOPROLOL SUCCINATE ER 100 MG PO TB24: 50.0000 mg | ORAL_TABLET | Freq: Every day | ORAL | 3 refills | Status: DC

## 2021-05-17 MED ORDER — ROSUVASTATIN CALCIUM 20 MG PO TABS: 20.0000 mg | ORAL_TABLET | Freq: Every day | ORAL | 3 refills | Status: DC

## 2021-05-17 NOTE — Patient Instructions (Signed)
Please return in 3 months for diabetes and blood pressure follow up   We are increasing the dose of your crestor and toprol xl.   Start walking!  Stretch your back.  Get a support brace for your wrist.   If you have any questions or concerns, please don't hesitate to send me a message via MyChart or call the office at [phone]. Thank you for visiting with [place] today! It's our pleasure caring for you.

## 2021-05-17 NOTE — Progress Notes (Signed)
Subjective  CC:  Chief Complaint  Patient presents with   Hypertension   Diabetes   Hyperlipidemia    HPI: Carol [name] is a 55 y.o. female who presents to the office today for follow up of diabetes and problems listed above in the chief complaint.  Diabetes follow up:  new onset, diet controlled. Trying to eat better. Weight is stable. Her diabetic control is reported as Improved.  She denies exertional CP or SOB or symptomatic hypoglycemia. She denies foot sores or paresthesias. She is due eye exam. Exercise is rare.  HTN with white coat: on Hyzaar 100/12.5 and toprol xl 50. Tolerates well. Had low potassium at last visit; now on low Potassium supplement. No cp or sob HLD: I wanted her to increase her crestor dose but she reamins on 10 nightly. ? Leg cramps.  Ros: + left wrist pain and left lower back "catch" intermittently.  Declines flu vaccine today. Education given.   Wt Readings from Last 3 Encounters:  05/17/21 193 lb 6.4 oz (87.7 kg)  03/18/21 194 lb (88 kg)  12/10/20 190 lb (86.2 kg)    BP Readings from Last 3 Encounters:  05/17/21 (!) 170/90  03/18/21 (!) 153/86  12/10/20 (!) 140/94    Assessment  1. Combined hyperlipidemia associated with type 2 diabetes mellitus ([place])   2. Prediabetes   3. White coat syndrome with diagnosis of hypertension   4. Stage 3a chronic kidney disease ([place])   5. Hypokalemia   6. Sinus tachycardia   7. Obesity (BMI 30-39.9)      Plan  Diabetes is currently well controlled. Diet only. Discussed diet and to start a walking program for weight loss.  HLD: increase to crestor 20 and monitor leg cramps. Recheck potassium on supplements and arb/hctz.  HTN: running high with tachy: increase toprol xl to 100 and continue hyzaar 100/12.5. will change to dyazide if bp does not respond so can push dose of diuretic. Pt agrees.    Follow up: .3 mo for dm and htn. Orders Placed This Encounter  Procedures   Basic metabolic panel   POCT HgB  A1C   Meds ordered this encounter  Medications   rosuvastatin (CRESTOR) 20 MG tablet    Sig: Take 1 tablet (20 mg total) by mouth daily.    Dispense:  90 tablet    Refill:  3   metoprolol succinate (TOPROL-XL) 100 MG 24 hr tablet    Sig: Take 0.5 tablets (50 mg total) by mouth daily. Take with or immediately following a meal.    Dispense:  90 tablet    Refill:  3      Immunization History  Administered Date(s) Administered   Influenza Whole 05/31/2018   Influenza,inj,Quad PF,6+ Mos 07/10/2015, 06/13/2017, 06/13/2018, 06/04/2019, 04/29/2020   Influenza-Unspecified 06/13/2017   PFIZER(Purple Top)SARS-COV-2 Vaccination 12/14/2019, 01/04/2020, 08/28/2020   Tdap 06/24/2015    Diabetes Related Lab Review: Lab Results  Component Value Date   HGBA1C 6.3 (A) 05/17/2021   HGBA1C 6.6 (H) 12/10/2020   HGBA1C 5.4 10/23/2019    No results found for: Derl Barrow Lab Results  Component Value Date   CREATININE 1.11 12/10/2020   BUN 13 12/10/2020   NA 139 12/10/2020   K 3.3 (L) 12/10/2020   CL 103 12/10/2020   CO2 30 12/10/2020   Lab Results  Component Value Date   CHOL 136 12/10/2020   CHOL 217 (H) 04/02/2019   CHOL 178 04/13/2018   Lab Results  Component Value  Date   HDL 35.70 (L) 12/10/2020   HDL 35.80 (L) 04/02/2019   HDL 39.50 04/13/2018   Lab Results  Component Value Date   LDLCALC 83 12/10/2020   LDLCALC 113 (H) 04/13/2018   LDLCALC 142 (H) 07/11/2017   Lab Results  Component Value Date   TRIG 83.0 12/10/2020   TRIG 222.0 (H) 04/02/2019   TRIG 126.0 04/13/2018   Lab Results  Component Value Date   CHOLHDL 4 12/10/2020   CHOLHDL 6 04/02/2019   CHOLHDL 4 04/13/2018   Lab Results  Component Value Date   LDLDIRECT 144.0 04/02/2019   The 10-year ASCVD risk score (Arnett DK, et al., 2019) is: 29.5%   Values used to calculate the score:     Age: 6 years     Sex: Female     Is Non-Hispanic African American: Yes     Diabetic: Yes     Tobacco  smoker: No     Systolic Blood Pressure: 123XX123 mmHg     Is BP treated: Yes     HDL Cholesterol: 35.7 mg/dL     Total Cholesterol: 136 mg/dL I have reviewed the PMH, Fam and Soc history. 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    Sinus tachycardia 12/13/2019    Priority: Medium   Seasonal allergic rhinitis due to pollen 12/10/2020    Priority: Low   Menopausal vaginal dryness 07/18/2018    Priority: Low   Hypokalemia 07/18/2018    Social History: Patient  reports that she has never smoked. She has never used smokeless tobacco. She reports current alcohol use. She reports that she does not use drugs.  Review of Systems: Ophthalmic: negative for eye pain, loss of vision or double vision Cardiovascular: negative for chest pain Respiratory: negative for SOB or persistent cough Gastrointestinal: negative for abdominal pain Genitourinary: negative for dysuria or gross hematuria MSK: negative for foot lesions Neurologic: negative for weakness or gait disturbance  Objective  Vitals: BP (!) 170/90   Pulse 96   Temp 98.2 F (36.8 C) (Temporal)   Ht '5\' 2"'$  (1.575 m)   Wt 193 lb 6.4 oz (87.7 kg)   LMP 11/27/2005   SpO2 99%   BMI 35.37 kg/m  General: well appearing, no acute distress  Psych:  Alert and oriented, normal mood and affect HEENT:  Normocephalic, atraumatic, moist mucous membranes, supple neck  Cardiovascular:  Nl S1 and S2, RRR without murmur, gallop or rub. no edema Respiratory:  Good breath sounds bilaterally, CTAB with normal effort, no rales Left wrist, mild swelling,otherwise  nl   Diabetic education: ongoing education regarding chronic disease management for diabetes was given today. We continue to reinforce the ABC's of diabetic management: A1c (<7 or 8 dependent upon patient), tight blood pressure control, and cholesterol management with goal LDL < 100 minimally. We discuss diet strategies, exercise recommendations, medication options and possible side effects. At each visit, we review recommended immunizations and preventive care recommendations for diabetics and stress that good diabetic control can prevent other problems. See below for this patient's data.   Commons side effects,  risks, benefits, and alternatives for medications and treatment plan prescribed today were discussed, and the patient expressed understanding of the given instructions. Patient is instructed to call or message via MyChart if he/she has any questions or concerns regarding our treatment plan. No barriers to understanding were identified. We discussed Red Flag symptoms and signs in detail. Patient expressed understanding regarding what to do in case of urgent or emergency type symptoms.  Medication list was reconciled, printed and provided to the patient in AVS. Patient instructions and summary information was reviewed with the patient as documented in the AVS. This note was prepared with assistance of Dragon voice recognition software. Occasional wrong-word or sound-a-like substitutions may have occurred due to the inherent limitations of voice recognition software  This visit occurred during the SARS-CoV-2 public health emergency.  Safety protocols were in place, including screening questions prior to the visit, additional usage of staff PPE, and extensive cleaning of exam room while observing appropriate contact time as indicated for disinfecting solutions.

## 2021-05-17 NOTE — Telephone Encounter (Signed)
Pt called requesting a work note from today. She would like it faxed over [phone] if possible. Please Advise.

## 2021-05-17 NOTE — Telephone Encounter (Signed)
Patient is calling in stating she is needing a work note for todays visit sent to her through [place] and asked for it to be faxed to [phone].

## 2021-05-17 NOTE — Telephone Encounter (Signed)
Has been faxed.

## 2021-05-18 ENCOUNTER — Encounter: Payer: Self-pay | Admitting: Family Medicine

## 2021-05-18 NOTE — Telephone Encounter (Signed)
Spoke with patient, let her know letter was sent over yesterday. Will refax if needed

## 2021-05-19 MED ORDER — METOPROLOL SUCCINATE ER 100 MG PO TB24: 100.0000 mg | ORAL_TABLET | Freq: Every day | ORAL | 3 refills | Status: DC

## 2021-05-28 ENCOUNTER — Encounter (HOSPITAL_BASED_OUTPATIENT_CLINIC_OR_DEPARTMENT_OTHER): Payer: Self-pay | Admitting: Obstetrics & Gynecology

## 2021-05-28 ENCOUNTER — Other Ambulatory Visit: Payer: Self-pay

## 2021-05-28 ENCOUNTER — Ambulatory Visit (INDEPENDENT_AMBULATORY_CARE_PROVIDER_SITE_OTHER): Payer: 59 | Admitting: Obstetrics & Gynecology

## 2021-05-28 VITALS — BP 190/104 | HR 88 | Ht 62.0 in | Wt 194.8 lb

## 2021-05-28 DIAGNOSIS — Z23 Encounter for immunization: Secondary | ICD-10-CM | POA: Diagnosis not present

## 2021-05-28 DIAGNOSIS — I10 Essential (primary) hypertension: Secondary | ICD-10-CM | POA: Diagnosis not present

## 2021-05-28 DIAGNOSIS — Z780 Asymptomatic menopausal state: Secondary | ICD-10-CM

## 2021-05-28 DIAGNOSIS — Z01419 Encounter for gynecological examination (general) (routine) without abnormal findings: Secondary | ICD-10-CM | POA: Diagnosis not present

## 2021-05-28 NOTE — Progress Notes (Signed)
55 y.o. [id number] Married Carol [name] female here for annual exam.  Continues to have elevated blood pressure issues.  On metoprolol and losartan-hctz.  Pt aware diastolic today is 403.  At times, feeling heart race.  She does not have a headache today but did have one earlier this week.  No visual changes.    Has a new job this year.  Working in Programmer, systems now.  Having some issues with spouse.  She is ready to divorce.  Patient's last menstrual period was 11/27/2005.          Sexually active: Yes.    The current method of family planning is status post hysterectomy.    Exercising: No.   Smoker:  no  Health Maintenance: Pap:  2007 History of abnormal Pap:  no MMG:  04/14/2021 Negative Colonoscopy:  09/18/2017, follow up 10 years BMD:   plan around age 75 Screening Labs: 11/2020   reports that she has never smoked. She has never used smokeless tobacco. She reports current alcohol use. She reports that she does not use drugs.  Past Medical History:  Diagnosis Date   Allergy    Anemia    Arthritis    Chicken pox    GERD (gastroesophageal reflux disease)    Hay fever    High cholesterol    Hypertension    Kidney disease 2011   Obesity (BMI 30-39.9) 10/23/2019    Past Surgical History:  Procedure Laterality Date   ABDOMINAL HYSTERECTOMY  2007   CESAREAN SECTION      Current Outpatient Medications  Medication Sig Dispense Refill   cholecalciferol (VITAMIN D) 1000 units tablet Take 1,000 Units by mouth daily.     ketoconazole (NIZORAL) 2 % shampoo      losartan-hydrochlorothiazide (HYZAAR) 100-12.5 MG tablet Take 1 tablet by mouth daily. 90 tablet 3   metoprolol succinate (TOPROL-XL) 100 MG 24 hr tablet Take 1 tablet (100 mg total) by mouth daily. Take with or immediately following a meal. 90 tablet 3   nystatin cream (MYCOSTATIN) Apply 1 application topically as needed.     potassium chloride (KLOR-CON) 10 MEQ tablet Take 1 tablet (10 mEq total) by mouth daily. 90  tablet 3   rosuvastatin (CRESTOR) 20 MG tablet Take 1 tablet (20 mg total) by mouth daily. 90 tablet 3   No current facility-administered medications for this visit.    Family History  Problem Relation Age of Onset   Hypertension Mother    High Cholesterol Mother    Kidney disease Mother    Colon polyps Mother    Hypertension Father    Hypertension Sister    High Cholesterol Brother    Kidney disease Brother    Hypertension Maternal Grandmother    Heart attack Maternal Grandfather    Depression Paternal Grandfather    Kidney disease Paternal Grandfather    Colon cancer Neg Hx    Esophageal cancer Neg Hx    Rectal cancer Neg Hx    Stomach cancer Neg Hx     Review of Systems  Cardiovascular:        Palpitations  All other systems reviewed and are negative.  Exam:   BP (!) 190/104 (BP Location: Right Arm, Patient Position: Sitting, Cuff Size: Large)   Pulse 88   Ht 5\' 2"  (1.575 m)   Wt 194 lb 12.8 oz (88.4 kg)   LMP 11/27/2005   BMI 35.63 kg/m   Height: 5\' 2"  (157.5 cm)  General appearance: alert, cooperative  and appears stated age Head: Normocephalic, without obvious abnormality, atraumatic Neck: no adenopathy, supple, symmetrical, trachea midline and thyroid normal to inspection and palpation Lungs: clear to auscultation bilaterally Breasts: normal appearance, no masses or tenderness Heart: regular rate and rhythm Abdomen: soft, non-tender; bowel sounds normal; no masses,  no organomegaly Extremities: extremities normal, atraumatic, no cyanosis or edema Skin: Skin color, texture, turgor normal. No rashes or lesions Lymph nodes: Cervical, supraclavicular, and axillary nodes normal. No abnormal inguinal nodes palpated Neurologic: Grossly normal  Pelvic: External genitalia:  no lesions              Urethra:  normal appearing urethra with no masses, tenderness or lesions              Bartholins and Skenes: normal                 Vagina: normal appearing vagina with  normal color and no discharge, no lesions              Cervix: absent              Pap taken: No. Bimanual Exam:  Uterus:  uterus absent              Adnexa: no mass, fullness, tenderness               Rectovaginal: Confirms               Anus:  normal sphincter tone, no lesions  Chaperone, Carol [name], Carol [name], was present for exam.  Assessment/Plan: 1. Well woman exam with routine gynecological exam - pap not indicated - MGM up to date - colonoscopy 2019, follow up 10 years - plan BMD around age 32 - lab work done 11/2020 - Care Gaps reviewed/updated - flu shot given today  2. Primary hypertension - Ambulatory referral to Cardiology  3. Postmenopausal - no HRT

## 2021-06-14 ENCOUNTER — Ambulatory Visit: Payer: 59 | Admitting: Family Medicine

## 2021-07-15 ENCOUNTER — Other Ambulatory Visit: Payer: Self-pay | Admitting: Family Medicine

## 2021-08-13 ENCOUNTER — Telehealth: Payer: Self-pay | Admitting: Family Medicine

## 2021-08-13 NOTE — Telephone Encounter (Signed)
Patient was unable to do any appointments today as she is at work. Patient will discuss issues at Wednesday appt.

## 2021-08-13 NOTE — Telephone Encounter (Signed)
Spoke with patient, gave a verbal understanding °

## 2021-08-13 NOTE — Telephone Encounter (Signed)
Offer patient an appointment, can be virtual. Dr.Andy has openings today

## 2021-08-13 NOTE — Telephone Encounter (Signed)
Mrs. Carol [name] stated she woke up this morning feels like she has sinus issues and stated that she has tried sudafed and it was working working but now the pain has moved from her jaw bone to her ear and feels like she an ear infection. And wanted to know if its something she can take due to its coming up the weekend. And she stated that she has an appointment next week.

## 2021-08-18 ENCOUNTER — Ambulatory Visit: Payer: 59 | Admitting: Family Medicine

## 2021-08-24 ENCOUNTER — Encounter (HOSPITAL_BASED_OUTPATIENT_CLINIC_OR_DEPARTMENT_OTHER): Payer: Self-pay | Admitting: Family Medicine

## 2021-09-02 ENCOUNTER — Encounter (HOSPITAL_BASED_OUTPATIENT_CLINIC_OR_DEPARTMENT_OTHER): Payer: Self-pay | Admitting: Cardiovascular Disease

## 2021-09-02 ENCOUNTER — Ambulatory Visit (INDEPENDENT_AMBULATORY_CARE_PROVIDER_SITE_OTHER): Payer: Self-pay | Admitting: Cardiovascular Disease

## 2021-09-02 ENCOUNTER — Other Ambulatory Visit: Payer: Self-pay

## 2021-09-02 VITALS — BP 154/86 | HR 112 | Ht 62.0 in | Wt 196.7 lb

## 2021-09-02 DIAGNOSIS — R000 Tachycardia, unspecified: Secondary | ICD-10-CM

## 2021-09-02 DIAGNOSIS — R0683 Snoring: Secondary | ICD-10-CM | POA: Insufficient documentation

## 2021-09-02 DIAGNOSIS — I10 Essential (primary) hypertension: Secondary | ICD-10-CM

## 2021-09-02 HISTORY — DX: Snoring: R06.83

## 2021-09-02 MED ORDER — VALSARTAN-HYDROCHLOROTHIAZIDE 320-25 MG PO TABS: 1.0000 | ORAL_TABLET | Freq: Every day | ORAL | 3 refills | Status: DC

## 2021-09-02 MED ORDER — CARVEDILOL 25 MG PO TABS: 25.0000 mg | ORAL_TABLET | Freq: Two times a day (BID) | ORAL | 3 refills | Status: DC

## 2021-09-02 NOTE — Progress Notes (Signed)
[facility] Initial Assessment:    Date:  09/02/2021   ID:  Carol [name], DOB Apr 28, 1966, MRN [id number]  PCP:  Leamon Arnt, MD  Cardiologist:  None  Nephrologist:  Referring MD: Megan Salon, MD   CC: Hypertension  History of Present Illness:    Carol [name] is a 56 y.o. female with a hx of hypertension, hyperlipidemia, GERD, anemia, and arthritis, here to establish care in the [facility]. She saw Dr. Sabra Heck 05/28/2021 and her blood pressure was 190/104 on metoprolol and losartan-HCTZ. At that appointment she reported feeling her heart racing at times. She was referred to the [facility]. Today, she reports having hypertension since she was in her 54's. Her blood pressure is 182/95, which she attributes to becoming very nervous on her way to clinic today. At home her heart rate is typically 65 bpm resting, and 70s-90s with exertion per her smart watch. She does feel her heart racing or skipping at times. Also, she has a little bit of edema that develops in her ankles due to sitting all day. When the weather permits, she will sometimes walk for exercise. This week, she has started trying to be active every day with stretches or other activities. She plans on increasing her exercise. Most of the time she orders out, but knows she needs to cook more meals at home. She does not drink much caffeine, and avoids alcohol. She endorses snoring, and sometimes she wakes up rested. She does fall asleep easily during the day. She denies any chest pain, or shortness of breath. No lightheadedness, headaches, syncope, orthopnea, PND, or exertional symptoms.  Previous antihypertensives: Amlodipine Doxazosin Lisinopril Spironolactone  Past Medical History:  Diagnosis Date   Allergy    Anemia    Arthritis    Chicken pox    GERD (gastroesophageal reflux disease)    Hay fever    High cholesterol    Hypertension    Kidney disease 2011    Obesity (BMI 30-39.9) 10/23/2019   Snoring 09/02/2021    Past Surgical History:  Procedure Laterality Date   ABDOMINAL HYSTERECTOMY  2007   CESAREAN SECTION      Current Medications: Current Meds  Medication Sig   carvedilol (COREG) 25 MG tablet Take 1 tablet (25 mg total) by mouth 2 (two) times daily.   cholecalciferol (VITAMIN D) 1000 units tablet Take 1,000 Units by mouth daily.   ketoconazole (NIZORAL) 2 % shampoo    nystatin cream (MYCOSTATIN) Apply 1 application topically as needed.   potassium chloride (KLOR-CON) 10 MEQ tablet Take 1 tablet (10 mEq total) by mouth daily.   rosuvastatin (CRESTOR) 20 MG tablet Take 1 tablet (20 mg total) by mouth daily.   valsartan-hydrochlorothiazide (DIOVAN-HCT) 320-25 MG tablet Take 1 tablet by mouth daily.   [DISCONTINUED] losartan-hydrochlorothiazide (HYZAAR) 100-12.5 MG tablet TAKE ONE TABLET BY MOUTH DAILY   [DISCONTINUED] metoprolol succinate (TOPROL-XL) 100 MG 24 hr tablet Take 1 tablet (100 mg total) by mouth daily. Take with or immediately following a meal.     Allergies:   Amlodipine, Doxazosin, Lisinopril, and Spironolactone   Social History   Socioeconomic History   Marital status: Married    Spouse name: Not on file   Number of children: Not on file   Years of education: Not on file   Highest education level: Not on file  Occupational History    Employer: [employer]  Tobacco Use   Smoking status: Never  Smokeless tobacco: Never  Vaping Use   Vaping Use: Never used  Substance and Sexual Activity   Alcohol use: Yes    Comment: occasionally   Drug use: No   Sexual activity: Yes    Partners: Male    Birth control/protection: Surgical  Other Topics Concern   Not on file  Social History Narrative   Not on file   Social Determinants of Health   Financial Resource Strain: Low Risk    Difficulty of Paying Living Expenses: Not hard at all  Food Insecurity: No Food Insecurity   Worried About Paediatric nurse in the Last Year: Never true   [place] in the Last Year: Never true  Transportation Needs: No Transportation Needs   Lack of Transportation (Medical): No   Lack of Transportation (Non-Medical): No  Physical Activity: Inactive   Days of Exercise per Week: 0 days   Minutes of Exercise per Session: 0 min  Stress: Not on file  Social Connections: Not on file     Family History: The patient's family history includes Colon polyps in her mother; Depression in her paternal grandfather; Heart attack in her maternal grandfather; High Cholesterol in her brother and mother; Hypertension in her father, maternal grandmother, mother, and sister; Kidney disease in her brother, mother, and paternal grandfather; Stroke in her maternal grandmother. There is no history of Colon cancer, Esophageal cancer, Rectal cancer, or Stomach cancer.  ROS:   Please see the history of present illness.    (+) Palpitations (+) Bilateral ankle edema (+) Snoring (+) Daytime somnolence All other systems reviewed and are negative.  EKGs/Labs/Other Studies Reviewed:    No prior cardiovascular studies available.   EKG:   09/02/2021: Sinus tachycardia. Rate 111 bpm. Nonspecific T wave abnormalities.  Recent Labs: 12/10/2020: ALT 21; Hemoglobin 12.2; Platelets 327.0; TSH 2.44 05/17/2021: BUN 13; Creatinine, Ser 1.11; Potassium 3.6; Sodium 139   Recent Lipid Panel    Component Value Date/Time   CHOL 136 12/10/2020 1038   CHOL 208 (H) 07/11/2017 1618   TRIG 83.0 12/10/2020 1038   HDL 35.70 (L) 12/10/2020 1038   HDL 35 (L) 07/11/2017 1618   CHOLHDL 4 12/10/2020 1038   VLDL 16.6 12/10/2020 1038   LDLCALC 83 12/10/2020 1038   LDLCALC 142 (H) 07/11/2017 1618   LDLDIRECT 144.0 04/02/2019 1612    Physical Exam:    VS:  BP (!) 154/86 (BP Location: Right Arm, Patient Position: Sitting, Cuff Size: Large)    Pulse (!) 112    Ht 5\' 2"  (1.575 m)    Wt 196 lb 11.2 oz (89.2 kg)    LMP 11/27/2005    BMI 35.98  kg/m  , BMI Body mass index is 35.98 kg/m. GENERAL:  Well appearing HEENT: Pupils equal round and reactive, fundi not visualized, oral mucosa unremarkable NECK:  No jugular venous distention, waveform within normal limits, carotid upstroke brisk and symmetric, no bruits, no thyromegaly LYMPHATICS:  No cervical adenopathy LUNGS:  Clear to auscultation bilaterally HEART:  RRR.  PMI not displaced or sustained,S1 and S2 within normal limits, no S3, no S4, no clicks, no rubs, no murmurs ABD:  Flat, positive bowel sounds normal in frequency in pitch, no bruits, no rebound, no guarding, no midline pulsatile mass, no hepatomegaly, no splenomegaly EXT:  2 plus pulses throughout, no edema, no cyanosis no clubbing SKIN:  No rashes no nodules NEURO:  Cranial nerves II through XII grossly intact, motor grossly intact throughout PSYCH:  Cognitively intact, oriented to person place and time   ASSESSMENT/PLAN:    White coat syndrome with diagnosis of hypertension Blood pressure is poorly controlled both at home and in the office.  She notes that she did better with the higher dose of diuretic in the past.  We will switch losartan/HCTZ to losartan 320/HCTZ 25 mg.  We will also switch metoprolol to carvedilol 25 mg twice daily.  She is tachycardic in the office but notes that this always happens when she goes to the doctor.  At home her heart rates have been within normal limits.  She does report that she snores and has some daytime somnolence.  She does not have a sleep partner and is unsure whether she has apneic episodes.  We will get a home sleep study to assess for sleep apnea.  She is going to work on cooking more at home and limiting her sodium intake.  She will also work on increasing her exercise 250 minutes weekly.  She lives far away and does not think that she would be able to participate in the PREP program at the [facility].  She is interested in our remote patient monitoring program and consents to  monitoring in the [facility] remote patient monitoring system.  Snoring Check sleep study as above.  Sinus tachycardia Tachycardic in the office but reports her heart rate is OK at home per her Apple watch.  She has occasional palpitations.   We will switch metoprolol to carvedilol for improved heart rate control.   Screening for Secondary Hypertension:  Causes 09/02/2021  Drugs/Herbals Screened     - Comments limited caffeine.  No EtOH. High sodium intake.    Relevant Labs/Studies: Basic Labs Latest Ref Rng & Units 05/17/2021 12/10/2020 12/13/2019  Sodium 135 - 145 mEq/L 139 139 137  Potassium 3.5 - 5.1 mEq/L 3.6 3.3(L) 3.5  Creatinine 0.40 - 1.20 mg/dL 1.11 1.11 1.19    Thyroid  Latest Ref Rng & Units 12/10/2020 12/13/2019  TSH 0.35 - 4.50 uIU/mL 2.44 3.82     Disposition:    FU with APP/PharmD in 1 month.   FU with Elayjah Chaney C. Oval Linsey, MD, [facility] in 4 months.   Medication Adjustments/Labs and Tests Ordered: Current medicines are reviewed at length with the patient today.  Concerns regarding medicines are outlined above.   Orders Placed This Encounter  Procedures   EKG 12-Lead   [facility] Sleep Study   Meds ordered this encounter  Medications   carvedilol (COREG) 25 MG tablet    Sig: Take 1 tablet (25 mg total) by mouth 2 (two) times daily.    Dispense:  180 tablet    Refill:  3    D/C METOPROLOL   valsartan-hydrochlorothiazide (DIOVAN-HCT) 320-25 MG tablet    Sig: Take 1 tablet by mouth daily.    Dispense:  90 tablet    Refill:  3    D/C LOSARTAN HCT RX   I,Mathew Stumpf,acting as a scribe for Skeet Latch, MD.,have documented all relevant documentation on the behalf of Skeet Latch, MD,as directed by  Skeet Latch, MD while in the presence of Skeet Latch, MD.  I, Johnston Oval Linsey, MD have reviewed all documentation for this visit.  The documentation of the exam, diagnosis, procedures, and orders on 09/02/2021 are all accurate and  complete.   Signed, Skeet Latch, MD  09/02/2021 4:58 PM    [place]

## 2021-09-02 NOTE — Patient Instructions (Addendum)
Medication Instructions:  STOP METOPROLOL   START CARVEDILOL 25 MG TWICE A DAY   STOP LOSARTAN HCT   START VALSARTAN 320-25 MG DAILY    Labwork: NONE   Testing/Procedures: HOME SLEEP STUDY  WILL CALL YOU WITH THE CODE WHEN INSURANCE HAS APPROVED  WatchPAT?  Is a FDA cleared portable home sleep study test that uses a watch and 3 points of contact to monitor 7 different channels, including your heart rate, oxygen saturations, body position, snoring, and chest motion.  The study is easy to use from the comfort of your own home and accurately detect sleep apnea.  Before bed, you attach the chest sensor, attached the sleep apnea bracelet to your nondominant hand, and attach the finger probe.  After the study, the raw data is downloaded from the watch and scored for apnea events.   For more information: [email or web address]   Follow-Up: 09/28/2021 AT 3:00 PM WITH PHARM D    Special Instructions:  MONITOR YOUR BLOOD PRESSURE 1 TO 2 TIMES A DAY LOG IN BOOK PROVIDED AND BRING TO YOUR FOLLOW UP IN 1 MONTH     DASH Eating Plan DASH stands for "Dietary Approaches to Stop Hypertension." The DASH eating plan is a healthy eating plan that has been shown to reduce high blood pressure (hypertension). It may also reduce your risk for type 2 diabetes, heart disease, and stroke. The DASH eating plan may also help with weight loss. What are tips for following this plan?  General guidelines Avoid eating more than 2,300 mg (milligrams) of salt (sodium) a day. If you have hypertension, you may need to reduce your sodium intake to 1,500 mg a day. Limit alcohol intake to no more than 1 drink a day for nonpregnant women and 2 drinks a day for men. One drink equals 12 oz of beer, 5 oz of wine, or 1 oz of hard liquor. Work with your health care provider to maintain a healthy body weight or to lose weight. Ask what an ideal weight is for you. Get at least 30 minutes of exercise that causes  your heart to beat faster (aerobic exercise) most days of the week. Activities may include walking, swimming, or biking. Work with your health care provider or diet and nutrition specialist (dietitian) to adjust your eating plan to your individual calorie needs. Reading food labels  Check food labels for the amount of sodium per serving. Choose foods with less than 5 percent of the Daily Value of sodium. Generally, foods with less than 300 mg of sodium per serving fit into this eating plan. To find whole grains, look for the word "whole" as the first word in the ingredient list. Shopping Buy products labeled as "low-sodium" or "no salt added." Buy fresh foods. Avoid canned foods and premade or frozen meals. Cooking Avoid adding salt when cooking. Use salt-free seasonings or herbs instead of table salt or sea salt. Check with your health care provider or pharmacist before using salt substitutes. Do not fry foods. Cook foods using healthy methods such as baking, boiling, grilling, and broiling instead. Cook with heart-healthy oils, such as olive, canola, soybean, or sunflower oil. Meal planning Eat a balanced diet that includes: 5 or more servings of fruits and vegetables each day. At each meal, try to fill half of your plate with fruits and vegetables. Up to 6-8 servings of whole grains each day. Less than 6 oz of lean meat, poultry, or fish each day. A 3-oz serving of meat is about the  same size as a deck of cards. One egg equals 1 oz. 2 servings of low-fat dairy each day. A serving of nuts, seeds, or beans 5 times each week. Heart-healthy fats. Healthy fats called Omega-3 fatty acids are found in foods such as flaxseeds and coldwater fish, like sardines, salmon, and mackerel. Limit how much you eat of the following: Canned or prepackaged foods. Food that is high in trans fat, such as fried foods. Food that is high in saturated fat, such as fatty meat. Sweets, desserts, sugary drinks, and  other foods with added sugar. Full-fat dairy products. Do not salt foods before eating. Try to eat at least 2 vegetarian meals each week. Eat more home-cooked food and less restaurant, buffet, and fast food. When eating at a restaurant, ask that your food be prepared with less salt or no salt, if possible. What foods are recommended? The items listed may not be a complete list. Talk with your dietitian about what dietary choices are best for you. Grains Whole-grain or whole-wheat bread. Whole-grain or whole-wheat pasta. Brown rice. [place]. Bulgur. Whole-grain and low-sodium cereals. Pita bread. Low-fat, low-sodium crackers. Whole-wheat flour tortillas. Vegetables Fresh or frozen vegetables (raw, steamed, roasted, or grilled). Low-sodium or reduced-sodium tomato and vegetable juice. Low-sodium or reduced-sodium tomato sauce and tomato paste. Low-sodium or reduced-sodium canned vegetables. Fruits All fresh, dried, or frozen fruit. Canned fruit in natural juice (without added sugar). Meat and other protein foods Skinless chicken or [place]. Ground chicken or [place]. Pork with fat trimmed off. Fish and seafood. Egg whites. Dried beans, peas, or lentils. Unsalted nuts, nut butters, and seeds. Unsalted canned beans. Lean cuts of beef with fat trimmed off. Low-sodium, lean deli meat. Dairy Low-fat (1%) or fat-free (skim) milk. Fat-free, low-fat, or reduced-fat cheeses. Nonfat, low-sodium ricotta or cottage cheese. Low-fat or nonfat yogurt. Low-fat, low-sodium cheese. Fats and oils Soft margarine without trans fats. Vegetable oil. Low-fat, reduced-fat, or light mayonnaise and salad dressings (reduced-sodium). Canola, safflower, olive, soybean, and sunflower oils. Avocado. Seasoning and other foods Herbs. Spices. Seasoning mixes without salt. Unsalted popcorn and pretzels. Fat-free sweets. What foods are not recommended? The items listed may not be a complete list. Talk with your dietitian  about what dietary choices are best for you. Grains Baked goods made with fat, such as croissants, muffins, or some breads. Dry pasta or rice meal packs. Vegetables Creamed or fried vegetables. Vegetables in a cheese sauce. Regular canned vegetables (not low-sodium or reduced-sodium). Regular canned tomato sauce and paste (not low-sodium or reduced-sodium). Regular tomato and vegetable juice (not low-sodium or reduced-sodium). Angie Fava. Olives. Fruits Canned fruit in a light or heavy syrup. Fried fruit. Fruit in cream or butter sauce. Meat and other protein foods Fatty cuts of meat. Ribs. Fried meat. Berniece Salines. Sausage. Bologna and other processed lunch meats. Salami. Fatback. Hotdogs. Bratwurst. Salted nuts and seeds. Canned beans with added salt. Canned or smoked fish. Whole eggs or egg yolks. Chicken or [place] with skin. Dairy Whole or 2% milk, cream, and half-and-half. Whole or full-fat cream cheese. Whole-fat or sweetened yogurt. Full-fat cheese. Nondairy creamers. Whipped toppings. Processed cheese and cheese spreads. Fats and oils Butter. Stick margarine. Lard. Shortening. Ghee. Bacon fat. Tropical oils, such as coconut, palm kernel, or palm oil. Seasoning and other foods Salted popcorn and pretzels. Onion salt, garlic salt, seasoned salt, table salt, and sea salt. Worcestershire sauce. Tartar sauce. Barbecue sauce. Teriyaki sauce. Soy sauce, including reduced-sodium. Steak sauce. Canned and packaged gravies. Fish sauce. Oyster sauce. Cocktail sauce.  Horseradish that you find on the shelf. Ketchup. Mustard. Meat flavorings and tenderizers. Bouillon cubes. Hot sauce and Tabasco sauce. Premade or packaged marinades. Premade or packaged taco seasonings. Relishes. Regular salad dressings. Where to find more information: National Heart, Lung, and [facility]: https://wilson-eaton.com/ American Heart Association: www.heart.org Summary The DASH eating plan is a healthy eating plan that has been shown to  reduce high blood pressure (hypertension). It may also reduce your risk for type 2 diabetes, heart disease, and stroke. With the DASH eating plan, you should limit salt (sodium) intake to 2,300 mg a day. If you have hypertension, you may need to reduce your sodium intake to 1,500 mg a day. When on the DASH eating plan, aim to eat more fresh fruits and vegetables, whole grains, lean proteins, low-fat dairy, and heart-healthy fats. Work with your health care provider or diet and nutrition specialist (dietitian) to adjust your eating plan to your individual calorie needs. This information is not intended to replace advice given to you by your health care provider. Make sure you discuss any questions you have with your health care provider. Document Released: 08/04/2011 Document Revised: 07/28/2017 Document Reviewed: 08/08/2016 Elsevier Patient Education  2020 [employer].

## 2021-09-02 NOTE — Assessment & Plan Note (Signed)
Check sleep study as above.

## 2021-09-02 NOTE — Assessment & Plan Note (Signed)
Blood pressure is poorly controlled both at home and in the office.  She notes that she did better with the higher dose of diuretic in the past.  We will switch losartan/HCTZ to losartan 320/HCTZ 25 mg.  We will also switch metoprolol to carvedilol 25 mg twice daily.  She is tachycardic in the office but notes that this always happens when she goes to the doctor.  At home her heart rates have been within normal limits.  She does report that she snores and has some daytime somnolence.  She does not have a sleep partner and is unsure whether she has apneic episodes.  We will get a home sleep study to assess for sleep apnea.  She is going to work on cooking more at home and limiting her sodium intake.  She will also work on increasing her exercise 250 minutes weekly.  She lives far away and does not think that she would be able to participate in the PREP program at the [facility].  She is interested in our remote patient monitoring program and consents to monitoring in the [place] remote patient monitoring system.

## 2021-09-02 NOTE — Assessment & Plan Note (Signed)
Tachycardic in the office but reports her heart rate is OK at home per her Apple watch.  She has occasional palpitations.   We will switch metoprolol to carvedilol for improved heart rate control.

## 2021-09-14 ENCOUNTER — Telehealth: Payer: Self-pay | Admitting: *Deleted

## 2021-09-14 NOTE — Telephone Encounter (Signed)
Called and spoke with the patient to get current insurance information in order to precert itamar. Patient states that she does not have her card yet. States that she should have it by the end of the month.  I asked that once she receives her new insurance information to call me so that I can see if she needs a pre certification for the itamar sleep test that she has been given by Dr Oval Linsey. Patient voiced understanding.

## 2021-09-28 ENCOUNTER — Telehealth (HOSPITAL_BASED_OUTPATIENT_CLINIC_OR_DEPARTMENT_OTHER): Payer: Self-pay | Admitting: Cardiovascular Disease

## 2021-09-28 ENCOUNTER — Other Ambulatory Visit: Payer: Self-pay

## 2021-09-28 ENCOUNTER — Ambulatory Visit (INDEPENDENT_AMBULATORY_CARE_PROVIDER_SITE_OTHER): Payer: 59 | Admitting: Pharmacist Clinician (PhC)/ Clinical Pharmacy Specialist

## 2021-09-28 ENCOUNTER — Other Ambulatory Visit (HOSPITAL_BASED_OUTPATIENT_CLINIC_OR_DEPARTMENT_OTHER): Payer: Self-pay | Admitting: Cardiovascular Disease

## 2021-09-28 ENCOUNTER — Encounter (HOSPITAL_BASED_OUTPATIENT_CLINIC_OR_DEPARTMENT_OTHER): Payer: Self-pay | Admitting: Pharmacist Clinician (PhC)/ Clinical Pharmacy Specialist

## 2021-09-28 DIAGNOSIS — I10 Essential (primary) hypertension: Secondary | ICD-10-CM | POA: Diagnosis not present

## 2021-09-28 NOTE — Patient Instructions (Addendum)
°  Check your blood pressure at home daily (if able) and keep record of the readings.  Take your BP meds as follows:  Continue with your current medications  Bring all of your meds, your BP cuff and your record of home blood pressures to your next appointment.  Exercise as youre able, try to walk approximately 30 minutes per day.  Keep salt intake to a minimum, especially watch canned and prepared boxed foods.  Eat more fresh fruits and vegetables and fewer canned items.  Avoid eating in fast food restaurants.    HOW TO TAKE YOUR BLOOD PRESSURE: Rest 5 minutes before taking your blood pressure.  Dont smoke or drink caffeinated beverages for at least 30 minutes before. Take your blood pressure before (not after) you eat. Sit comfortably with your back supported and both feet on the floor (dont cross your legs). Elevate your arm to heart level on a table or a desk. Use the proper sized cuff. It should fit smoothly and snugly around your bare upper arm. There should be enough room to slip a fingertip under the cuff. The bottom edge of the cuff should be 1 inch above the crease of the elbow. Ideally, take 3 measurements at one sitting and record the average.

## 2021-09-28 NOTE — Telephone Encounter (Signed)
Pt came in for her Pharm appt today with Tommy Medal, RPH-CPP. She brought her WatchPAT device and said she was not able to do the study right now because it is not in her budget.   Device has been unregistered and is placed back in [facility] office inventory.

## 2021-09-28 NOTE — Assessment & Plan Note (Signed)
Patient with hypertension and white coat syndrome.  Her home cuff read within 2 points of the office machine today, showing accuracy of home readings.  Home average well within normal limits.  Will have her continue with current medications and follow up with Dr. Oval Linsey in 4 months.

## 2021-09-28 NOTE — Progress Notes (Signed)
09/28/2021 Carol [name] 1966/05/13 [id number]   HPI:  Carol [name] is a 56 y.o. female patient of Dr Oval Linsey, who presents today for hypertension clinic evaluation.  In addition to hypertension, her medical history is significant for hyperlipidemia and sinus tachycardia.  She reported to Dr. Oval Linsey that she had dealt with hypertension since she was in her 28's and attributes much of it to white coat hypertension.   At her visit with Dr. Oval Linsey, pressure was 154/86, but 182/95 on first check.  Dr. Oval Linsey switched her losartan hctz to valsartan hctz 320/25 qd and metoprolol to carvedilol 25 mg bid  Today she returns for follow up visit.  States no problems with either of the new medications and is mostly compliant.  Admits to missing evening medications about once or sometimes twice a week.  No concerns for morning doses.     Blood Pressure Goal:  130/80  Current Medications: valsartan hctz 320/25, carvedilol 25 bid both at 1/5 visit  Family Hx: both parents with hypertension, well controlled, now in their 62's; both siblings younger with hypertension; kids 81, 3 both healthy  Social Hx: no tobacco, no alcohol, only decaf sodas, occasional coffee   Diet: eating at home more than previously, now eating more salads and vegetables; not a lot meat, mostly fish patties or chicken, occasional pork chop; not much for snacking, usually popcorn  Exercise: has started some walking, stretches in the house  Home BP readings:   AM: 20 readings average 118/78 HR 92  (range 106-133/70-88)  PM:  16 readings average 125/79 HR 85 (range 107-133/74-85)  Intolerances: lisinopril -cough, amlodipine - edema, spironolactone - edema, doxazosin - lethargy  Labs: 9/22:  Na 139, K 3.6, Glu 105, BUN 13, SCr 1.11, GFR 56.4   Wt Readings from Last 3 Encounters:  09/28/21 196 lb (88.9 kg)  09/02/21 196 lb 11.2 oz (89.2 kg)  05/28/21 194 lb 12.8 oz (88.4 kg)   BP Readings from Last 3  Encounters:  09/28/21 (!) 161/105  09/02/21 (!) 154/86  05/28/21 (!) 190/104   Pulse Readings from Last 3 Encounters:  09/28/21 91  09/02/21 (!) 112  05/28/21 88    Current Outpatient Medications  Medication Sig Dispense Refill   carvedilol (COREG) 25 MG tablet Take 1 tablet (25 mg total) by mouth 2 (two) times daily. 180 tablet 3   cholecalciferol (VITAMIN D) 1000 units tablet Take 1,000 Units by mouth daily.     ketoconazole (NIZORAL) 2 % shampoo      nystatin cream (MYCOSTATIN) Apply 1 application topically as needed.     potassium chloride (KLOR-CON) 10 MEQ tablet Take 1 tablet (10 mEq total) by mouth daily. 90 tablet 3   rosuvastatin (CRESTOR) 20 MG tablet Take 1 tablet (20 mg total) by mouth daily. 90 tablet 3   valsartan-hydrochlorothiazide (DIOVAN-HCT) 320-25 MG tablet Take 1 tablet by mouth daily. 90 tablet 3   vitamin B-12 (CYANOCOBALAMIN) 500 MCG tablet Take 500 mcg by mouth daily.     No current facility-administered medications for this visit.    Allergies  Allergen Reactions   Amlodipine     Other reaction(s): OTHER   Doxazosin     Other reaction(s): OTHER   Lisinopril Other (See Comments)    unknown   Spironolactone Other (See Comments)    Edema    Past Medical History:  Diagnosis Date   Allergy    Anemia    Arthritis    Chicken pox  GERD (gastroesophageal reflux disease)    Hay fever    High cholesterol    Hypertension    Kidney disease 2011   Obesity (BMI 30-39.9) 10/23/2019   Snoring 09/02/2021    Blood pressure (!) 161/105, pulse 91, height 5\' 2"  (1.575 m), weight 196 lb (88.9 kg), last menstrual period 11/27/2005.  Home cuff read in office at 160/103  White coat syndrome with diagnosis of hypertension Patient with hypertension and white coat syndrome.  Her home cuff read within 2 points of the office machine today, showing accuracy of home readings.  Home average well within normal limits.  Will have her continue with current medications and  follow up with Dr. Oval Linsey in 4 months.     Tommy Medal PharmD CPP [facility] [address] [place], [place] [address] [phone]

## 2021-09-29 NOTE — Telephone Encounter (Signed)
Skeet Latch, MD to Ernie Hew D  Me  Jackqulyn Livings, RN     12:58 PM OK thanks.

## 2021-10-04 ENCOUNTER — Telehealth (HOSPITAL_BASED_OUTPATIENT_CLINIC_OR_DEPARTMENT_OTHER): Payer: Self-pay | Admitting: Cardiovascular Disease

## 2021-10-04 NOTE — Telephone Encounter (Signed)
Patient called in to say that she has cramping in here feet and hands unsure if its because of the medication or not. Please advise

## 2021-10-04 NOTE — Telephone Encounter (Signed)
Left message to call back  

## 2021-10-06 NOTE — Telephone Encounter (Signed)
Notes cramping in her hands and feet starting Saturday. It has since subsided. Takes her potassium daily. Notes she might have been dehydrated. We discussed possibly repeating labs to check potassium/magnesium as electrolyte disturbance in setting of HCTZ use likely etiology. She prefers to continue to monitor symptoms as they are improving. She was appreciative of the call.   If she contacts [place] noting recurrence recommend lab work to include BMP, magnesium.   Loel Dubonnet, NP

## 2021-12-21 ENCOUNTER — Other Ambulatory Visit: Payer: Self-pay

## 2021-12-21 ENCOUNTER — Telehealth: Payer: Self-pay | Admitting: Family Medicine

## 2021-12-21 MED ORDER — POTASSIUM CHLORIDE CRYS ER 10 MEQ PO TBCR: 10.0000 meq | EXTENDED_RELEASE_TABLET | Freq: Every day | ORAL | 0 refills | Status: DC

## 2021-12-21 NOTE — Telephone Encounter (Addendum)
Pt requests refill to get her to the new 12/29/21  appt. ? ?.. ?Encourage patient to contact the pharmacy for refills or they can request refills through [facility] ? ?LAST APPOINTMENT DATE:   ?05/17/21 ? ?NEXT APPOINTMENT DATE: ?12/29/21 ? ? ?MEDICATION: ?potassium chloride (KLOR-CON) 10 MEQ tablet [[id number]]  ? ?Is the patient out of medication?  ?Yes ? ? ?PHARMACY: ?[facility] [phone] - [place], [place] [address]  ?[place], [place] [address]  ?Phone:  [phone]  Fax:  [phone]  ?DEA #:  -- ?DAW Reason: -- ? ? ? ?Let patient know to contact pharmacy at the end of the day to make sure medication is ready. ? ?Please notify patient to allow 48-72 hours to process  ?

## 2021-12-21 NOTE — Telephone Encounter (Signed)
Rx sent 

## 2021-12-29 ENCOUNTER — Ambulatory Visit: Payer: Commercial Managed Care - PPO | Admitting: Family Medicine

## 2021-12-29 ENCOUNTER — Ambulatory Visit (INDEPENDENT_AMBULATORY_CARE_PROVIDER_SITE_OTHER): Payer: Commercial Managed Care - PPO | Admitting: Family Medicine

## 2021-12-29 ENCOUNTER — Encounter: Payer: Self-pay | Admitting: Family Medicine

## 2021-12-29 VITALS — BP 130/70 | HR 91 | Temp 98.4°F | Ht 62.0 in | Wt 190.8 lb

## 2021-12-29 DIAGNOSIS — I10 Essential (primary) hypertension: Secondary | ICD-10-CM | POA: Diagnosis not present

## 2021-12-29 DIAGNOSIS — E782 Mixed hyperlipidemia: Secondary | ICD-10-CM

## 2021-12-29 DIAGNOSIS — E1169 Type 2 diabetes mellitus with other specified complication: Secondary | ICD-10-CM | POA: Diagnosis not present

## 2021-12-29 DIAGNOSIS — E119 Type 2 diabetes mellitus without complications: Secondary | ICD-10-CM | POA: Diagnosis not present

## 2021-12-29 LAB — POCT GLYCOSYLATED HEMOGLOBIN (HGB A1C): Hemoglobin A1C: 6.3 % — AB (ref 4.0–5.6)

## 2021-12-29 NOTE — Patient Instructions (Signed)
Please return in 6 months for diabetes and blood pressure check.  ? ?I will release your lab results to you on your MyChart account with further instructions. You may see the results before I do, but when I review them I will send you a message with my report or have my assistant call you if things need to be discussed. Please reply to my message with any questions. Thank you!  ? ?If you have any questions or concerns, please don't hesitate to send me a message via MyChart or call the office at [phone]. Thank you for visiting with [place] today! It's our pleasure caring for you.  ? ?Hypertension, Adult ?High blood pressure (hypertension) is when the force of blood pumping through the arteries is too strong. The arteries are the blood vessels that carry blood from the heart throughout the body. Hypertension forces the heart to work harder to pump blood and may cause arteries to become narrow or stiff. Untreated or uncontrolled hypertension can lead to a heart attack, heart failure, a stroke, kidney disease, and other problems. ?A blood pressure reading consists of a higher number over a lower number. Ideally, your blood pressure should be below 120/80. The first ("top") number is called the systolic pressure. It is a measure of the pressure in your arteries as your heart beats. The second ("bottom") number is called the diastolic pressure. It is a measure of the pressure in your arteries as the heart relaxes. ?What are the causes? ?The exact cause of this condition is not known. There are some conditions that result in high blood pressure. ?What increases the risk? ?Certain factors may make you more likely to develop high blood pressure. Some of these risk factors are under your control, including: ?Smoking. ?Not getting enough exercise or physical activity. ?Being overweight. ?Having too much fat, sugar, calories, or salt (sodium) in your diet. ?Drinking too much alcohol. ?Other risk factors include: ?Having a  personal history of heart disease, diabetes, high cholesterol, or kidney disease. ?Stress. ?Having a family history of high blood pressure and high cholesterol. ?Having obstructive sleep apnea. ?Age. The risk increases with age. ?What are the signs or symptoms? ?High blood pressure may not cause symptoms. Very high blood pressure (hypertensive crisis) may cause: ?Headache. ?Fast or irregular heartbeats (palpitations). ?Shortness of breath. ?Nosebleed. ?Nausea and vomiting. ?Vision changes. ?Severe chest pain, dizziness, and seizures. ?How is this diagnosed? ?This condition is diagnosed by measuring your blood pressure while you are seated, with your arm resting on a flat surface, your legs uncrossed, and your feet flat on the floor. The cuff of the blood pressure monitor will be placed directly against the skin of your upper arm at the level of your heart. Blood pressure should be measured at least twice using the same arm. Certain conditions can cause a difference in blood pressure between your right and left arms. ?If you have a high blood pressure reading during one visit or you have normal blood pressure with other risk factors, you may be asked to: ?Return on a different day to have your blood pressure checked again. ?Monitor your blood pressure at home for 1 week or longer. ?If you are diagnosed with hypertension, you may have other blood or imaging tests to help your health care provider understand your overall risk for other conditions. ?How is this treated? ?This condition is treated by making healthy lifestyle changes, such as eating healthy foods, exercising more, and reducing your alcohol intake. You may be referred for  counseling on a healthy diet and physical activity. ?Your health care provider may prescribe medicine if lifestyle changes are not enough to get your blood pressure under control and if: ?Your systolic blood pressure is above 130. ?Your diastolic blood pressure is above 80. ?Your personal  target blood pressure may vary depending on your medical conditions, your age, and other factors. ?Follow these instructions at home: ?Eating and drinking ? ?Eat a diet that is high in fiber and potassium, and low in sodium, added sugar, and fat. An example of this eating plan is called the DASH diet. DASH stands for Dietary Approaches to Stop Hypertension. To eat this way: ?Eat plenty of fresh fruits and vegetables. Try to fill one half of your plate at each meal with fruits and vegetables. ?Eat whole grains, such as whole-wheat pasta, brown rice, or whole-grain bread. Fill about one fourth of your plate with whole grains. ?Eat or drink low-fat dairy products, such as skim milk or low-fat yogurt. ?Avoid fatty cuts of meat, processed or cured meats, and poultry with skin. Fill about one fourth of your plate with lean proteins, such as fish, chicken without skin, beans, eggs, or tofu. ?Avoid pre-made and processed foods. These tend to be higher in sodium, added sugar, and fat. ?Reduce your daily sodium intake. Many people with hypertension should eat less than 1,500 mg of sodium a day. ?Do not drink alcohol if: ?Your health care provider tells you not to drink. ?You are pregnant, may be pregnant, or are planning to become pregnant. ?If you drink alcohol: ?Limit how much you have to: ?0-1 drink a day for women. ?0-2 drinks a day for men. ?Know how much alcohol is in your drink. In the [place] one drink equals one 12 oz bottle of beer (355 mL), one 5 oz glass of wine (148 mL), or one 1? oz glass of hard liquor (44 mL). ?Lifestyle ? ?Work with your health care provider to maintain a healthy body weight or to lose weight. Ask what an ideal weight is for you. ?Get at least 30 minutes of exercise that causes your heart to beat faster (aerobic exercise) most days of the week. Activities may include walking, swimming, or biking. ?Include exercise to strengthen your muscles (resistance exercise), such as Pilates or lifting  weights, as part of your weekly exercise routine. Try to do these types of exercises for 30 minutes at least 3 days a week. ?Do not use any products that contain nicotine or tobacco. These products include cigarettes, chewing tobacco, and vaping devices, such as e-cigarettes. If you need help quitting, ask your health care provider. ?Monitor your blood pressure at home as told by your health care provider. ?Keep all follow-up visits. This is important. ?Medicines ?Take over-the-counter and prescription medicines only as told by your health care provider. Follow directions carefully. Blood pressure medicines must be taken as prescribed. ?Do not skip doses of blood pressure medicine. Doing this puts you at risk for problems and can make the medicine less effective. ?Ask your health care provider about side effects or reactions to medicines that you should watch for. ?Contact a health care provider if you: ?Think you are having a reaction to a medicine you are taking. ?Have headaches that keep coming back (recurring). ?Feel dizzy. ?Have swelling in your ankles. ?Have trouble with your vision. ?Get help right away if you: ?Develop a severe headache or confusion. ?Have unusual weakness or numbness. ?Feel faint. ?Have severe pain in your chest or abdomen. ?Vomit  repeatedly. ?Have trouble breathing. ?These symptoms may be an emergency. Get help right away. Call 911. ?Do not wait to see if the symptoms will go away. ?Do not drive yourself to the hospital. ?Summary ?Hypertension is when the force of blood pumping through your arteries is too strong. If this condition is not controlled, it may put you at risk for serious complications. ?Your personal target blood pressure may vary depending on your medical conditions, your age, and other factors. For most people, a normal blood pressure is less than 120/80. ?Hypertension is treated with lifestyle changes, medicines, or a combination of both. Lifestyle changes include losing  weight, eating a healthy, low-sodium diet, exercising more, and limiting alcohol. ?This information is not intended to replace advice given to you by your health care provider. Make sure you discuss any questi

## 2021-12-29 NOTE — Progress Notes (Signed)
? ? ?Subjective  ?CC:  ?Chief Complaint  ?Patient presents with  ? Hypertension  ?  Pt here for a 75mo F/U with DM and BP.  ? Diabetes  ? ? ?HPI: Carol [name] a 56y.o. female who presents to the office today for follow up of diabetes and problems listed above in the chief complaint.  ?Diabetes follow up: Her diabetic control is reported as Unchanged. Diet controlled. No sxs of hyperglycemia.  She denies exertional CP or SOB or symptomatic hypoglycemia. She denies foot sores or paresthesias. Reports normal eye exam fall of last year.  ?White coat htn; reviewed cards notes. Has tachycadia. Reports home readings are norma. Feeling well. Taking medications w/o adverse effects. No symptoms of CHF, angina; no palpitations, sob, cp or lower extremity edema. Compliant with meds.  ?Hld tolerating crestor 20 nighlty. Non fasting for repeat. Goal ldl < 70.  ? ?Wt Readings from Last 3 Encounters:  ?12/29/21 190 lb 12.8 oz (86.5 kg)  ?09/28/21 196 lb (88.9 kg)  ?09/02/21 196 lb 11.2 oz (89.2 kg)  ? ? ?BP Readings from Last 3 Encounters:  ?12/29/21 130/70  ?09/28/21 (!) 161/105  ?09/02/21 (!) 154/86  ? ? ?Assessment  ?1. White coat syndrome with diagnosis of hypertension   ?2. Combined hyperlipidemia associated with type 2 diabetes mellitus ([place]   ?3. Diet-controlled diabetes mellitus ([place]   ? ?  ?Plan  ?Diabetes is currently very well controlled. Diet controlled. To work on weight loss and diet. Check urine today.  ?HTN: normal at home. Continue current meds. Check renal function and electrolytes. ?HLD: recheck lipids and tsh. And liver tests. ? ?Follow up: 6 mo for diabetes and bp recheck . ?Orders Placed This Encounter  ?Procedures  ? Comprehensive metabolic panel  ? Lipid panel  ? TSH  ? CBC with Differential/Platelet  ? Microalbumin / creatinine urine ratio  ? POCT HgB A1C  ? ?No orders of the defined types were placed in this encounter. ? ?  ? ?Immunization History  ?Administered Date(s) Administered  ? Influenza  Whole 05/31/2018  ? Influenza,inj,Quad PF,6+ Mos 07/10/2015, 06/13/2017, 06/13/2018, 06/04/2019, 04/29/2020, 05/28/2021  ? Influenza-Unspecified 06/13/2017  ? PFIZER(Purple Top)SARS-COV-2 Vaccination 12/14/2019, 01/04/2020, 08/28/2020  ? Tdap 06/24/2015  ? ? ?Diabetes Related Lab Review: ?Lab Results  ?Component Value Date  ? HGBA1C 6.3 (A) 12/29/2021  ? HGBA1C 6.3 (A) 05/17/2021  ? HGBA1C 6.6 (H) 12/10/2020  ?  ?No results found for: MDerl B[place]?Lab Results  ?Component Value Date  ? CREATININE 1.11 05/17/2021  ? BUN 13 05/17/2021  ? NA 139 05/17/2021  ? K 3.6 05/17/2021  ? CL 101 05/17/2021  ? CO2 30 05/17/2021  ? ?Lab Results  ?Component Value Date  ? CHOL 136 12/10/2020  ? CHOL 217 (H) 04/02/2019  ? CHOL 178 04/13/2018  ? ?Lab Results  ?Component Value Date  ? HDL 35.70 (L) 12/10/2020  ? HDL 35.80 (L) 04/02/2019  ? HDL 39.50 04/13/2018  ? ?Lab Results  ?Component Value Date  ? LWhitakers83 12/10/2020  ? LDLCALC 113 (H) 04/13/2018  ? LDLCALC 142 (H) 07/11/2017  ? ?Lab Results  ?Component Value Date  ? TRIG 83.0 12/10/2020  ? TRIG 222.0 (H) 04/02/2019  ? TRIG 126.0 04/13/2018  ? ?Lab Results  ?Component Value Date  ? CHOLHDL 4 12/10/2020  ? CHOLHDL 6 04/02/2019  ? CHOLHDL 4 04/13/2018  ? ?Lab Results  ?Component Value Date  ? LDLDIRECT 144.0 04/02/2019  ? ?The 10-year ASCVD risk  score (Arnett DK, et al., 2019) is: 12.7% ?  Values used to calculate the score: ?    Age: 56 years ?    Sex: Female ?    Is Non-Hispanic African American: Yes ?    Diabetic: Yes ?    Tobacco smoker: No ?    Systolic Blood Pressure: 440 mmHg ?    Is BP treated: Yes ?    HDL Cholesterol: 35.7 mg/dL ?    Total Cholesterol: 136 mg/dL ?I have reviewed the [place], Fam and Soc history. ?Patient Active Problem List  ? Diagnosis Date Noted  ? Combined hyperlipidemia associated with type 2 diabetes mellitus ([place]) 05/17/2021  ?  Priority: High  ? Obesity (BMI 30-39.9) 10/23/2019  ?  Priority: High  ? White coat syndrome with diagnosis of  hypertension 07/18/2018  ?  Priority: High  ? Chronic kidney disease, stage III (moderate) ([place]) 12/03/2010  ?  Priority: High  ?  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 ? ?  ? Sinus tachycardia 12/13/2019  ?  Priority: Medium   ? Seasonal allergic rhinitis due to pollen 12/10/2020  ?  Priority: Low  ? Menopausal vaginal dryness 07/18/2018  ?  Priority: Low  ? Snoring 09/02/2021  ? Hypokalemia 07/18/2018  ? ? ?Social History: ?Patient  reports that she has never smoked. She has never used smokeless tobacco. She reports current alcohol use. She reports that she does not use drugs. ? ?Review of Systems: ?Ophthalmic: negative for eye pain, loss of vision or double vision ?Cardiovascular: negative for chest pain ?Respiratory: negative for SOB or persistent cough ?Gastrointestinal: negative for abdominal pain ?Genitourinary: negative for dysuria or gross hematuria ?MSK: negative for foot lesions ?Neurologic: negative for weakness or gait disturbance ? ?Objective  ?Vitals: BP 130/70 Comment: home readings  Pulse 91   Temp 98.4 ?F (36.9 ?C)   Ht '5\' 2"'$  (1.575 m)   Wt 190 lb 12.8 oz (86.5 kg)   LMP 11/27/2005   SpO2 98%   BMI 34.90 kg/m?  ?General: well appearing, no acute distress  ?Psych:  Alert and oriented, normal mood and affect ?HEENT:  Normocephalic, atraumatic, moist mucous membranes, supple neck  ?Cardiovascular:  Nl S1 and S2, RRR without murmur, gallop or rub. no edema ?Respiratory:  Good breath sounds bilaterally, CTAB with normal effort, no rales ?Gastrointestinal: normal BS, soft, nontender ?Skin:  Warm, no rashes ?Neurologic:   Mental status is normal. normal gait ?Foot exam: no erythema, pallor, or cyanosis visible nl proprioception and sensation to monofilament testing bilaterally, +2 distal pulses bilaterally ? ? ? ?Diabetic education: ongoing education regarding chronic disease management for diabetes was given today. We continue to reinforce the ABC's of diabetic management:  A1c (<7 or 8 dependent upon patient), tight blood pressure control, and cholesterol management with goal LDL < 100 minimally. We discuss diet strategies, exercise recommendations, medication options and possible side effects. At each visit, we review recommended immunizations and preventive care recommendations for diabetics and stress that good diabetic control can prevent other problems. See below for this patient's data. ? ? ?Commons side effects, risks, benefits, and alternatives for medications and treatment plan prescribed today were discussed, and the patient expressed understanding of the given instructions. Patient is instructed to call or message via MyChart if he/she has any questions or concerns regarding our treatment plan. No barriers to understanding were identified. We discussed Red Flag symptoms and signs in detail. Patient expressed understanding regarding what to do in case of urgent  or emergency type symptoms.  ?Medication list was reconciled, printed and provided to the patient in AVS. Patient instructions and summary information was reviewed with the patient as documented in the AVS. ?This note was prepared with assistance of Systems analyst. Occasional wrong-word or sound-a-like substitutions may have occurred due to the inherent limitations of voice recognition software ? ?This visit occurred during the SARS-CoV-2 public health emergency.  Safety protocols were in place, including screening questions prior to the visit, additional usage of staff PPE, and extensive cleaning of exam room while observing appropriate contact time as indicated for disinfecting solutions.  ? ?

## 2021-12-30 LAB — MICROALBUMIN / CREATININE URINE RATIO
Creatinine,U: 36.9 mg/dL
Microalb Creat Ratio: 1.9 mg/g (ref 0.0–30.0)
Microalb, Ur: <0.7 mg/dL (ref 0.0–1.9)

## 2021-12-30 LAB — CBC WITH DIFFERENTIAL/PLATELET
Basophils Absolute: 0.1 10*3/uL (ref 0.0–0.1)
Basophils Relative: 1.1 % (ref 0.0–3.0)
Eosinophils Absolute: 0.2 10*3/uL (ref 0.0–0.7)
Eosinophils Relative: 1.8 % (ref 0.0–5.0)
HCT: 38.9 % (ref 36.0–46.0)
Hemoglobin: 12.2 g/dL (ref 12.0–15.0)
Lymphocytes Relative: 26.1 % (ref 12.0–46.0)
Lymphs Abs: 2.7 10*3/uL (ref 0.7–4.0)
MCHC: 31.3 g/dL (ref 30.0–36.0)
MCV: 74.9 fl — ABNORMAL LOW (ref 78.0–100.0)
Monocytes Absolute: 0.8 10*3/uL (ref 0.1–1.0)
Monocytes Relative: 7.4 % (ref 3.0–12.0)
Neutro Abs: 6.6 10*3/uL (ref 1.4–7.7)
Neutrophils Relative %: 63.6 % (ref 43.0–77.0)
Platelets: 320 10*3/uL (ref 150.0–400.0)
RBC: 5.2 Mil/uL — ABNORMAL HIGH (ref 3.87–5.11)
RDW: 15.9 % — ABNORMAL HIGH (ref 11.5–15.5)
WBC: 10.4 10*3/uL (ref 4.0–10.5)

## 2021-12-30 LAB — COMPREHENSIVE METABOLIC PANEL
ALT: 22 U/L (ref 0–35)
AST: 16 U/L (ref 0–37)
Albumin: 4 g/dL (ref 3.5–5.2)
Alkaline Phosphatase: 69 U/L (ref 39–117)
BUN: 15 mg/dL (ref 6–23)
CO2: 29 mEq/L (ref 19–32)
Calcium: 9.6 mg/dL (ref 8.4–10.5)
Chloride: 100 mEq/L (ref 96–112)
Creatinine, Ser: 1.19 mg/dL (ref 0.40–1.20)
GFR: 51.63 mL/min — ABNORMAL LOW (ref >60.00)
Glucose, Bld: 91 mg/dL (ref 70–99)
Potassium: 3.9 mEq/L (ref 3.5–5.1)
Sodium: 138 mEq/L (ref 135–145)
Total Bilirubin: 0.3 mg/dL (ref 0.2–1.2)
Total Protein: 7.6 g/dL (ref 6.0–8.3)

## 2021-12-30 LAB — LIPID PANEL
Cholesterol: 138 mg/dL (ref 0–200)
HDL: 39.7 mg/dL (ref >39.00)
LDL Cholesterol: 75 mg/dL (ref 0–99)
NonHDL: 98.1
Total CHOL/HDL Ratio: 3
Triglycerides: 115 mg/dL (ref 0.0–149.0)
VLDL: 23 mg/dL (ref 0.0–40.0)

## 2021-12-30 LAB — TSH: TSH: 3.51 u[IU]/mL (ref 0.35–5.50)

## 2021-12-31 ENCOUNTER — Other Ambulatory Visit: Payer: Self-pay | Admitting: Family Medicine

## 2022-01-03 ENCOUNTER — Other Ambulatory Visit: Payer: Self-pay | Admitting: Family Medicine

## 2022-01-03 ENCOUNTER — Other Ambulatory Visit: Payer: Self-pay

## 2022-01-03 MED ORDER — POTASSIUM CHLORIDE CRYS ER 10 MEQ PO TBCR
10.0000 meq | EXTENDED_RELEASE_TABLET | Freq: Every day | ORAL | 0 refills | Status: DC
Start: 2022-01-03 — End: 2022-01-04

## 2022-01-04 ENCOUNTER — Telehealth: Payer: Self-pay | Admitting: Family Medicine

## 2022-01-04 ENCOUNTER — Other Ambulatory Visit: Payer: Self-pay

## 2022-01-04 MED ORDER — POTASSIUM CHLORIDE CRYS ER 10 MEQ PO TBCR: 10.0000 meq | EXTENDED_RELEASE_TABLET | Freq: Every day | ORAL | 0 refills | Status: DC

## 2022-01-04 NOTE — Telephone Encounter (Signed)
Patient stated she got a potassium refill for 10 tablets only- she did not pick up meds. She needs a full refill. Out of meds.  ?

## 2022-01-04 NOTE — Telephone Encounter (Signed)
Rx resent for 30 tabs ? ?

## 2022-01-07 ENCOUNTER — Ambulatory Visit (INDEPENDENT_AMBULATORY_CARE_PROVIDER_SITE_OTHER): Payer: Commercial Managed Care - PPO | Admitting: Cardiovascular Disease

## 2022-01-07 ENCOUNTER — Encounter (HOSPITAL_BASED_OUTPATIENT_CLINIC_OR_DEPARTMENT_OTHER): Payer: Self-pay | Admitting: Cardiovascular Disease

## 2022-01-07 DIAGNOSIS — I10 Essential (primary) hypertension: Secondary | ICD-10-CM | POA: Diagnosis not present

## 2022-01-07 DIAGNOSIS — E782 Mixed hyperlipidemia: Secondary | ICD-10-CM | POA: Diagnosis not present

## 2022-01-07 DIAGNOSIS — E1169 Type 2 diabetes mellitus with other specified complication: Secondary | ICD-10-CM | POA: Diagnosis not present

## 2022-01-07 MED ORDER — AMLODIPINE BESYLATE 2.5 MG PO TABS: 2.5000 mg | ORAL_TABLET | Freq: Every day | ORAL | 3 refills | Status: DC

## 2022-01-07 NOTE — Progress Notes (Unsigned)
? ?Advanced Hypertension Clinic Initial Assessment:   ? ?Date:  01/07/2022  ? ?ID:  Carol Carol [name], DOB 1966-05-30, MRN [id number] ? ?PCP:  Carol Arnt, MD  ?Cardiologist:  None  ?Nephrologist: ? ?Referring MD: Carol Arnt, MD  ? ?CC: Hypertension ? ?History of Present Illness:   ? ?Carol Carol [name] is a 56 y.o. female with a hx of hypertension, hyperlipidemia, GERD, anemia, and arthritis, here to establish care in the Advanced Hypertension Clinic. She saw Carol Carol [name] 05/28/2021 and her blood pressure was 190/104 on metoprolol and losartan-HCTZ. At that appointment she reported feeling her heart racing at times. She was referred to the Advanced Hypertension Clinic. She reported having hypertension since she was in her 33's.  She noted that her blood pressure was better controlled with higher doses of diuretic.  Losartan/HCTZ was switched to valsartan/HCTZ.  She reported palpitations.  Metoprolol was switched to carvedilol.  She was referred to the prep program and for a sleep study.  She followed up with our pharmacist and was doing well.  At home her blood pressure was averaging 118/78 in the mornings and 125/79 in the evenings.  In the office her blood pressure was 161/105.  Her blood pressure machine was within 2 points of the in office machine. ? ?Lately she has been feeling well.  Her blood pressures are slightly higher in April and May than they were in March.  Lately it has been averaging in the 120s to 130s over 70s to 80s.  She notes that she has not been getting as much exercise lately.  She also has a dental abscess and has been struggling with some pain in her mouth.  This Carol [name] she had to take a dose of ibuprofen but does not typically have to take this.  It should be removed next week.  She has been under a lot of stress at home lately.  She is excited about the birth of her new Carol Carol [name]. ? ?Previous antihypertensives: ?Amlodipine ?Doxazosin ?Lisinopril ?Spironolactone ? ?Past Medical  History:  ?Diagnosis Date  ? Allergy   ? Anemia   ? Arthritis   ? Chicken pox   ? GERD (gastroesophageal reflux disease)   ? Hay fever   ? High cholesterol   ? Hypertension   ? Kidney disease 2011  ? Obesity (BMI 30-39.9) 10/23/2019  ? Snoring 09/02/2021  ? ? ?Past Surgical History:  ?Procedure Laterality Date  ? ABDOMINAL HYSTERECTOMY  2007  ? CESAREAN SECTION    ? ? ?Current Medications: ?Current Meds  ?Medication Sig  ? amLODipine (NORVASC) 2.5 MG tablet Take 1 tablet (2.5 mg total) by mouth daily.  ? carvedilol (COREG) 25 MG tablet Take 1 tablet (25 mg total) by mouth 2 (two) times daily.  ? cholecalciferol (VITAMIN D) 1000 units tablet Take 1,000 Units by mouth daily.  ? ketoconazole (NIZORAL) 2 % shampoo   ? nystatin cream (MYCOSTATIN) Apply 1 application topically as needed.  ? potassium chloride (KLOR-CON M) 10 MEQ tablet Take 1 tablet (10 mEq total) by mouth daily.  ? rosuvastatin (CRESTOR) 20 MG tablet Take 1 tablet (20 mg total) by mouth daily.  ? valsartan-hydrochlorothiazide (DIOVAN-HCT) 320-25 MG tablet Take 1 tablet by mouth daily.  ? vitamin B-12 (CYANOCOBALAMIN) 500 MCG tablet Take 500 mcg by mouth daily.  ?  ? ?Allergies:   Amlodipine, Doxazosin, Lisinopril, and Spironolactone  ? ?Social History  ? ?Socioeconomic History  ? Marital status: Married  ?  Spouse name: Not on  file  ? Number of children: Not on file  ? Years of education: Not on file  ? Highest education level: Not on file  ?Occupational History  ?  Employer: [employer]  ?Tobacco Use  ? Smoking status: Never  ? Smokeless tobacco: Never  ?Vaping Use  ? Vaping Use: Never used  ?Substance and Sexual Activity  ? Alcohol use: Yes  ?  Comment: occasionally  ? Drug use: No  ? Sexual activity: Yes  ?  Partners: Male  ?  Birth control/protection: Surgical  ?Other Topics Concern  ? Not on file  ?Social History Narrative  ? Not on file  ? ?Social Determinants of Health  ? ?Financial Resource Strain: Low Risk   ? Difficulty of Paying  Living Expenses: Not hard at all  ?Food Insecurity: No Food Insecurity  ? Worried About Charity fundraiser in the Last Year: Never true  ? Ran Out of Food in the Last Year: Never true  ?Transportation Needs: No Transportation Needs  ? Lack of Transportation (Medical): No  ? Lack of Transportation (Non-Medical): No  ?Physical Activity: Inactive  ? Days of Exercise per Week: 0 days  ? Minutes of Exercise per Session: 0 min  ?Stress: Not on file  ?Social Connections: Not on file  ?  ? ?Family History: ?The patient's family history includes Colon polyps in her mother; Depression in her paternal grandfather; Heart attack in her maternal grandfather; High Cholesterol in her brother and mother; Hypertension in her father, maternal grandmother, mother, and sister; Kidney disease in her brother, mother, and paternal grandfather; Stroke in her maternal grandmother. There is no history of Colon cancer, Esophageal cancer, Rectal cancer, or Stomach cancer. ? ?ROS:   ?Please see the history of present illness.    ?(+) Palpitations ?(+) Bilateral ankle edema ?(+) Snoring ?(+) Daytime somnolence ?All other systems reviewed and are negative. ? ?EKGs/Labs/Other Studies Reviewed:   ? ?No prior cardiovascular studies available. ? ? ?EKG:   ?09/02/2021: Sinus tachycardia. Rate 111 bpm. Nonspecific T wave abnormalities. ? ?Recent Labs: ?12/29/2021: ALT 22; BUN 15; Creatinine, Ser 1.19; Hemoglobin 12.2; Platelets 320.0; Potassium 3.9; Sodium 138; TSH 3.51  ? ?Recent Lipid Panel ?   ?Component Value Date/Time  ? CHOL 138 12/29/2021 1642  ? CHOL 208 (H) 07/11/2017 1618  ? TRIG 115.0 12/29/2021 1642  ? HDL 39.70 12/29/2021 1642  ? HDL 35 (L) 07/11/2017 1618  ? CHOLHDL 3 12/29/2021 1642  ? VLDL 23.0 12/29/2021 1642  ? [place] 75 12/29/2021 1642  ? LDLCALC 142 (H) 07/11/2017 1618  ? LDLDIRECT 144.0 04/02/2019 1612  ? ? ?Physical Exam:   ? ?VS:  BP (!) 144/84   Pulse 75   Ht '5\' 2"'$  (1.575 m)   Wt 188 lb 4.8 oz (85.4 kg)   LMP 11/27/2005   SpO2  100%   BMI 34.44 kg/m?  , BMI Body mass index is 34.44 kg/m?. ?GENERAL:  Well appearing ?HEENT: Pupils equal round and reactive, fundi not visualized, oral mucosa unremarkable ?NECK:  No jugular venous distention, waveform within normal limits, carotid upstroke brisk and symmetric, no bruits, no thyromegaly ?LUNGS:  Clear to auscultation bilaterally ?HEART:  RRR.  PMI not displaced or sustained,S1 and S2 within normal limits, no S3, no S4, no clicks, no rubs, no murmurs ?ABD:  Flat, positive bowel sounds normal in frequency in pitch, no bruits, no rebound, no guarding, no midline pulsatile mass, no hepatomegaly, no splenomegaly ?EXT:  2 plus pulses throughout, no  edema, no cyanosis no clubbing ?SKIN:  No rashes no nodules ?NEURO:  Cranial nerves II through XII grossly intact, motor grossly intact throughout ?PSYCH:  Cognitively intact, oriented to person place and time ? ? ?ASSESSMENT/PLAN:   ? ?White coat syndrome with diagnosis of hypertension ?Blood pressures at home are slightly above goal.  They are averaging in the 120s to 130s over 70s to 80s.  She is going to work on getting back into exercising.  She is also been under a lot of stress at home.  For now, we will add amlodipine 2.5 mg daily.  She will continue taking her carvedilol and valsartan/HCTZ.  Keep checking blood pressures at home and follow-up in a couple months. ? ?Combined hyperlipidemia associated with type 2 diabetes mellitus ([place]) ?LDL was 75 on 12/2021.  Continue rosuvastatin.  Keep working on diet and exercise. ? ? ? ?Screening for Secondary Hypertension:  ? ?  09/02/2021  ?  4:34 PM  ?Causes  ?Drugs/Herbals Screened  ?   - Comments limited caffeine.  No EtOH. High sodium intake.  ?  ?Relevant Labs/Studies: ? ?  Latest Ref Rng & Units 12/29/2021  ?  4:42 PM 05/17/2021  ?  9:57 AM 12/10/2020  ? 10:38 AM  ?Basic Labs  ?Sodium 135 - 145 mEq/L 138   139   139    ?Potassium 3.5 - 5.1 mEq/L 3.9   3.6   3.3    ?Creatinine 0.40 - 1.20 mg/dL 1.19   1.11    1.11    ?  ? ?  Latest Ref Rng & Units 12/29/2021  ?  4:42 PM 12/10/2020  ? 10:38 AM  ?Thyroid   ?TSH 0.35 - 5.50 uIU/mL 3.51   2.44    ?  ? ?Disposition:    ?FU with APP/PharmD in 1 month.   ?FU with Dezman Granda C

## 2022-01-07 NOTE — Assessment & Plan Note (Signed)
LDL was 75 on 12/2021.  Continue rosuvastatin.  Keep working on diet and exercise. ?

## 2022-01-07 NOTE — Assessment & Plan Note (Signed)
Blood pressures at home are slightly above goal.  They are averaging in the 120s to 130s over 70s to 80s.  She is going to work on getting back into exercising.  She is also been under a lot of stress at home.  For now, we will add amlodipine 2.5 mg daily.  She will continue taking her carvedilol and valsartan/HCTZ.  Keep checking blood pressures at home and follow-up in a couple months. ?

## 2022-01-07 NOTE — Patient Instructions (Addendum)
Medication Instructions:  ?START AMLODIPINE 2.5 MG DAILY  ? ?Labwork: ?NONE ? ?Testing/Procedures: ?NONE ? ?Follow-Up: ? ?03/09/2022 3:30 PM WITH PHARM D AT [place]  ?

## 2022-01-14 ENCOUNTER — Ambulatory Visit (HOSPITAL_BASED_OUTPATIENT_CLINIC_OR_DEPARTMENT_OTHER): Payer: 59 | Admitting: Cardiovascular Disease

## 2022-01-31 ENCOUNTER — Other Ambulatory Visit: Payer: Self-pay | Admitting: Family Medicine

## 2022-02-21 ENCOUNTER — Encounter (HOSPITAL_BASED_OUTPATIENT_CLINIC_OR_DEPARTMENT_OTHER): Payer: Self-pay | Admitting: Cardiovascular Disease

## 2022-03-09 ENCOUNTER — Ambulatory Visit: Payer: Commercial Managed Care - PPO

## 2022-03-17 ENCOUNTER — Ambulatory Visit (INDEPENDENT_AMBULATORY_CARE_PROVIDER_SITE_OTHER): Payer: Commercial Managed Care - PPO | Admitting: Pharmacist Clinician (PhC)/ Clinical Pharmacy Specialist

## 2022-03-17 ENCOUNTER — Encounter: Payer: Self-pay | Admitting: Pharmacist Clinician (PhC)/ Clinical Pharmacy Specialist

## 2022-03-17 DIAGNOSIS — I10 Essential (primary) hypertension: Secondary | ICD-10-CM

## 2022-03-17 NOTE — Patient Instructions (Signed)
Return for a a follow up appointment in one year with Dr. Oval Linsey  Check your blood pressure at home 2-3 times per week and keep record of the readings.  Take your BP meds as follows:  Continue with your current medications.  Move amlodipine to evenings for better balance.  Bring all of your meds, your BP cuff and your record of home blood pressures to your next appointment.  Exercise as you're able, try to walk approximately 30 minutes per day.  Keep salt intake to a minimum, especially watch canned and prepared boxed foods.  Eat more fresh fruits and vegetables and fewer canned items.  Avoid eating in fast food restaurants.    HOW TO TAKE YOUR BLOOD PRESSURE: Rest 5 minutes before taking your blood pressure.  Don't smoke or drink caffeinated beverages for at least 30 minutes before. Take your blood pressure before (not after) you eat. Sit comfortably with your back supported and both feet on the floor (don't cross your legs). Elevate your arm to heart level on a table or a desk. Use the proper sized cuff. It should fit smoothly and snugly around your bare upper arm. There should be enough room to slip a fingertip under the cuff. The bottom edge of the cuff should be 1 inch above the crease of the elbow. Ideally, take 3 measurements at one sitting and record the average.  o

## 2022-03-17 NOTE — Progress Notes (Signed)
03/21/2022 Carol [name] [birth year] [id number]   HPI:  Carol [name] is a 56 y.o. female patient of Dr Oval Linsey, who presents today for hypertension clinic evaluation.  In addition to hypertension, her medical history is significant for hyperlipidemia and sinus tachycardia.  She reported to Dr. Oval Linsey that she had dealt with hypertension since she was in her 29's and attributes much of it to white coat hypertension.   At her visit with Dr. Oval Linsey, pressure was 154/86, but 182/95 on first check.  Dr. Oval Linsey switched her losartan hctz to valsartan hctz 320/25 qd and metoprolol to carvedilol 25 mg bid.  I saw her last in January, at which time her home cuff was tested and shown to be within 2 points of office reading.  She does have problems with white coat hypertension.  Dr. Oval Linsey saw her in May, again office reading was elevated.  It was noted home readings were slightly elevated at that time and amlodipine 2.5 mg daily was added to her meds.   Today she returns for follow up visit. No concerns or complaints today.      Blood Pressure Goal:  130/80  Current Medications: valsartan hctz 320/25, carvedilol 25 bid, amlodipine 2.5 mg qd  Family Hx: both parents with hypertension, well controlled, now in their 56's; both siblings younger with hypertension; kids 70, 47 both healthy  Social Hx: no tobacco, no alcohol, only decaf sodas, occasional coffee   Diet: eating at home more than previously, now eating more salads and vegetables; not a lot meat, mostly fish patties or chicken, occasional pork chop; not much for snacking, usually popcorn  Exercise: has started some walking, stretches in the house  Home BP readings: no readings with her today, but she notes most readings are 244-010 systolic range.  Only elevated when she goes to medical appointments.   Intolerances: lisinopril -cough, amlodipine - edema, spironolactone - edema, doxazosin - lethargy  Labs: 9/22:  Na 139, K  3.6, Glu 105, BUN 13, SCr 1.11, GFR 56.4   Wt Readings from Last 3 Encounters:  01/07/22 188 lb 4.8 oz (85.4 kg)  12/29/21 190 lb 12.8 oz (86.5 kg)  09/28/21 196 lb (88.9 kg)   BP Readings from Last 3 Encounters:  03/17/22 (!) 146/90  01/07/22 (!) 144/84  12/29/21 130/70   Pulse Readings from Last 3 Encounters:  03/17/22 76  01/07/22 75  12/29/21 91    Current Outpatient Medications  Medication Sig Dispense Refill   amLODipine (NORVASC) 2.5 MG tablet Take 1 tablet (2.5 mg total) by mouth daily. 90 tablet 3   carvedilol (COREG) 25 MG tablet Take 1 tablet (25 mg total) by mouth 2 (two) times daily. 180 tablet 3   cholecalciferol (VITAMIN D) 1000 units tablet Take 1,000 Units by mouth daily.     ketoconazole (NIZORAL) 2 % shampoo      Multiple Vitamin (MULTIVITAMIN) tablet Take 1 tablet by mouth daily.     nystatin cream (MYCOSTATIN) Apply 1 application topically as needed.     potassium chloride (KLOR-CON M) 10 MEQ tablet TAKE ONE TABLET BY MOUTH DAILY 90 tablet 3   rosuvastatin (CRESTOR) 20 MG tablet Take 1 tablet (20 mg total) by mouth daily. 90 tablet 3   valsartan-hydrochlorothiazide (DIOVAN-HCT) 320-25 MG tablet Take 1 tablet by mouth daily. 90 tablet 3   vitamin B-12 (CYANOCOBALAMIN) 500 MCG tablet Take 500 mcg by mouth daily.     No current facility-administered medications for this visit.  Allergies  Allergen Reactions   Amlodipine     Other reaction(s): OTHER   Doxazosin     Other reaction(s): OTHER   Lisinopril Other (See Comments)    unknown   Spironolactone Other (See Comments)    Edema    Past Medical History:  Diagnosis Date   Allergy    Anemia    Arthritis    Chicken pox    GERD (gastroesophageal reflux disease)    Hay fever    High cholesterol    Hypertension    Kidney disease 2011   Obesity (BMI 30-39.9) 10/23/2019   Snoring 09/02/2021    Blood pressure (!) 146/90, pulse 76, last menstrual period 11/27/2005.  Home cuff read in office at  160/103  White coat syndrome with diagnosis of hypertension Patient with essential hypertension, currently doing well on combination of 4 medications.  Will have her shift amlodipine to evenings for better balance, but otherwise no changes.  She should continue with regular home monitoring, at lease a few days each week.   Advised that should she see rise in home readings she should reach out to office.     Tommy Medal PharmD CPP [facility] [address] [place], [place] [address] [phone]

## 2022-03-18 ENCOUNTER — Other Ambulatory Visit: Payer: Self-pay | Admitting: Obstetrics & Gynecology

## 2022-03-18 DIAGNOSIS — Z1231 Encounter for screening mammogram for malignant neoplasm of breast: Secondary | ICD-10-CM

## 2022-03-21 NOTE — Assessment & Plan Note (Signed)
Patient with essential hypertension, currently doing well on combination of 4 medications.  Will have her shift amlodipine to evenings for better balance, but otherwise no changes.  She should continue with regular home monitoring, at lease a few days each week.   Advised that should she see rise in home readings she should reach out to office.

## 2022-04-15 ENCOUNTER — Ambulatory Visit: Payer: Commercial Managed Care - PPO

## 2022-04-15 ENCOUNTER — Ambulatory Visit
Admission: RE | Admit: 2022-04-15 | Discharge: 2022-04-15 | Disposition: A | Discharge status: Home / Self Care | Payer: Commercial Managed Care - PPO | Source: Ambulatory Visit | Attending: Obstetrics & Gynecology | Admitting: Obstetrics & Gynecology

## 2022-04-15 DIAGNOSIS — Z1231 Encounter for screening mammogram for malignant neoplasm of breast: Secondary | ICD-10-CM

## 2022-05-23 ENCOUNTER — Encounter: Payer: Self-pay | Admitting: *Deleted

## 2022-06-17 ENCOUNTER — Encounter (HOSPITAL_BASED_OUTPATIENT_CLINIC_OR_DEPARTMENT_OTHER): Payer: Self-pay | Admitting: Obstetrics & Gynecology

## 2022-06-17 ENCOUNTER — Ambulatory Visit (INDEPENDENT_AMBULATORY_CARE_PROVIDER_SITE_OTHER): Payer: Commercial Managed Care - PPO | Admitting: Obstetrics & Gynecology

## 2022-06-17 VITALS — BP 156/89 | HR 76 | Ht 62.0 in | Wt 190.8 lb

## 2022-06-17 DIAGNOSIS — N951 Menopausal and female climacteric states: Secondary | ICD-10-CM | POA: Diagnosis not present

## 2022-06-17 DIAGNOSIS — Z23 Encounter for immunization: Secondary | ICD-10-CM | POA: Diagnosis not present

## 2022-06-17 DIAGNOSIS — Z01419 Encounter for gynecological examination (general) (routine) without abnormal findings: Secondary | ICD-10-CM

## 2022-06-17 DIAGNOSIS — I10 Essential (primary) hypertension: Secondary | ICD-10-CM | POA: Diagnosis not present

## 2022-06-17 DIAGNOSIS — Z90710 Acquired absence of both cervix and uterus: Secondary | ICD-10-CM | POA: Diagnosis not present

## 2022-06-17 DIAGNOSIS — N1831 Chronic kidney disease, stage 3a: Secondary | ICD-10-CM

## 2022-06-17 NOTE — Progress Notes (Signed)
56 y.o. G40P2002 Married Carol [name] female here for annual exam.  Denies vaginal bleeding.  Did see Dr. Oval Linsey last year.  Blood pressure is much better.  Has 27 month old grandchild.  Sees or facetimes every day.   Patient's last menstrual period was 11/27/2005.          Sexually active: Yes.    The current method of family planning is status post hysterectomy.    Smoker:  no  Health Maintenance: Pap:  not indicated History of abnormal Pap:  no MMG:  04/15/2022 Negative Colonoscopy:  09/18/2017, follow up 10 years Screening Labs: 12/2021   reports that she has never smoked. She has never used smokeless tobacco. She reports current alcohol use. She reports that she does not use drugs.  Past Medical History:  Diagnosis Date   Allergy    Anemia    Arthritis    Chicken pox    GERD (gastroesophageal reflux disease)    Hay fever    High cholesterol    Hypertension    Kidney disease 2011   Obesity (BMI 30-39.9) 10/23/2019   Snoring 09/02/2021    Past Surgical History:  Procedure Laterality Date   ABDOMINAL HYSTERECTOMY  2007   CESAREAN SECTION      Current Outpatient Medications  Medication Sig Dispense Refill   amLODipine (NORVASC) 2.5 MG tablet Take 1 tablet (2.5 mg total) by mouth daily. 90 tablet 3   carvedilol (COREG) 25 MG tablet Take 1 tablet (25 mg total) by mouth 2 (two) times daily. 180 tablet 3   cholecalciferol (VITAMIN D) 1000 units tablet Take 1,000 Units by mouth daily.     ketoconazole (NIZORAL) 2 % shampoo      Multiple Vitamin (MULTIVITAMIN) tablet Take 1 tablet by mouth daily.     nystatin cream (MYCOSTATIN) Apply 1 application topically as needed.     potassium chloride (KLOR-CON M) 10 MEQ tablet TAKE ONE TABLET BY MOUTH DAILY 90 tablet 3   rosuvastatin (CRESTOR) 20 MG tablet Take 1 tablet (20 mg total) by mouth daily. 90 tablet 3   valsartan-hydrochlorothiazide (DIOVAN-HCT) 320-25 MG tablet Take 1 tablet by mouth daily. 90 tablet 3   vitamin  B-12 (CYANOCOBALAMIN) 500 MCG tablet Take 500 mcg by mouth daily. (Patient not taking: Reported on 06/17/2022)     No current facility-administered medications for this visit.    Family History  Problem Relation Age of Onset   Hypertension Mother    High Cholesterol Mother    Kidney disease Mother    Colon polyps Mother    Hypertension Father    Hypertension Sister    High Cholesterol Brother    Kidney disease Brother    Hypertension Maternal Grandmother    Stroke Maternal Grandmother    Heart attack Maternal Grandfather    Depression Paternal Grandfather    Kidney disease Paternal Grandfather    Colon cancer Neg Hx    Esophageal cancer Neg Hx    Rectal cancer Neg Hx    Stomach cancer Neg Hx     ROS: Constitutional: negative Genitourinary:negative  Exam:   BP (!) 156/89 (BP Location: Right Arm, Patient Position: Sitting, Cuff Size: Large)   Pulse 76   Ht '5\' 2"'$  (1.575 m) Comment: Reported  Wt 190 lb 12.8 oz (86.5 kg)   LMP 11/27/2005   BMI 34.90 kg/m   Height: '5\' 2"'$  (157.5 cm) (Reported)  General appearance: alert, cooperative and appears stated age Head: Normocephalic, without obvious abnormality, atraumatic  Neck: no adenopathy, supple, symmetrical, trachea midline and thyroid normal to inspection and palpation Lungs: clear to auscultation bilaterally Breasts: normal appearance, no masses or tenderness Heart: regular rate and rhythm Abdomen: soft, non-tender; bowel sounds normal; no masses,  no organomegaly Extremities: extremities normal, atraumatic, no cyanosis or edema Skin: Skin color, texture, turgor normal. No rashes or lesions Lymph nodes: Cervical, supraclavicular, and axillary nodes normal. No abnormal inguinal nodes palpated Neurologic: Grossly normal   Pelvic: External genitalia:  no lesions              Urethra:  normal appearing urethra with no masses, tenderness or lesions              Bartholins and Skenes: normal                 Vagina: normal  appearing vagina with normal color and no discharge, no lesions              Cervix: no lesions              Pap taken: No. Bimanual Exam:  Uterus:  uterus absent              Adnexa: no mass, fullness, tenderness               Rectovaginal: Confirms               Anus:  normal sphincter tone, no lesions  Chaperone, Octaviano Batty, CMA, was present for exam.  Assessment/Plan: 1. Well woman exam with routine gynecological exam - Pap smear not indicated - Mammogram 03/2022 - Colonoscopy 2019.  Follow up 10 years - Bone mineral density will be planned around ag e60 - lab work done done with Dr. Jonni Sanger 12/2021 - vaccines reviewed/updated.  Desires flu shot today.  2. H/O: hysterectomy  3. Menopausal vaginal dryness  4. White coat syndrome with diagnosis of hypertension - improved today  5. Stage 3a chronic kidney disease ([place])

## 2022-07-06 ENCOUNTER — Ambulatory Visit: Payer: Commercial Managed Care - PPO | Admitting: Family Medicine

## 2022-07-14 ENCOUNTER — Other Ambulatory Visit: Payer: Self-pay | Admitting: Family Medicine

## 2022-07-14 DIAGNOSIS — E1169 Type 2 diabetes mellitus with other specified complication: Secondary | ICD-10-CM

## 2022-07-18 ENCOUNTER — Ambulatory Visit (INDEPENDENT_AMBULATORY_CARE_PROVIDER_SITE_OTHER): Payer: Commercial Managed Care - PPO | Admitting: Family Medicine

## 2022-07-18 ENCOUNTER — Encounter: Payer: Self-pay | Admitting: Family Medicine

## 2022-07-18 VITALS — BP 128/72 | HR 92 | Temp 98.4°F | Ht 62.0 in | Wt 187.6 lb

## 2022-07-18 DIAGNOSIS — I10 Essential (primary) hypertension: Secondary | ICD-10-CM

## 2022-07-18 DIAGNOSIS — R000 Tachycardia, unspecified: Secondary | ICD-10-CM

## 2022-07-18 DIAGNOSIS — N1831 Chronic kidney disease, stage 3a: Secondary | ICD-10-CM

## 2022-07-18 DIAGNOSIS — Z23 Encounter for immunization: Secondary | ICD-10-CM

## 2022-07-18 DIAGNOSIS — E1169 Type 2 diabetes mellitus with other specified complication: Secondary | ICD-10-CM

## 2022-07-18 DIAGNOSIS — E119 Type 2 diabetes mellitus without complications: Secondary | ICD-10-CM | POA: Diagnosis not present

## 2022-07-18 DIAGNOSIS — E782 Mixed hyperlipidemia: Secondary | ICD-10-CM

## 2022-07-18 LAB — POCT GLYCOSYLATED HEMOGLOBIN (HGB A1C): Hemoglobin A1C: 6.6 % — AB (ref 4.0–5.6)

## 2022-07-18 NOTE — Progress Notes (Signed)
Subjective  CC:  Chief Complaint  Patient presents with   Diabetes   Hypertension    HPI: Carol [name] is a 56 y.o. female who presents to the office today for follow up of diabetes and problems listed above in the chief complaint.  Diabetes follow up: Her diabetic control is reported as Unchanged. Although she has had more sweets due to halloween etc.  She denies exertional CP or SOB or symptomatic hypoglycemia. She denies foot sores or paresthesias. Reports had eye exam this spring: dr. Clovis Fredrickson in [place]. Eligible for prevnar 20 HTN: reviewed cards notes. Stble and controlled on 4 med regimen. No cp. HLD at goal on statin Sinus tach is stable on coreg. No palpitations  Wt Readings from Last 3 Encounters:  07/18/22 187 lb 9.6 oz (85.1 kg)  06/17/22 190 lb 12.8 oz (86.5 kg)  01/07/22 188 lb 4.8 oz (85.4 kg)    BP Readings from Last 3 Encounters:  07/18/22 128/72  06/17/22 (!) 156/89  03/17/22 (!) 146/90    Assessment  1. Diet-controlled diabetes mellitus ([place])   2. White coat syndrome with diagnosis of hypertension   3. Combined hyperlipidemia associated with type 2 diabetes mellitus (HCC)   4. Stage 3a chronic kidney disease ([place])   5. Sinus tachycardia   6. Need for pneumococcal 20-valent conjugate vaccination      Plan  Diabetes is currently well controlled. Discussed adjusting diet and walking for exercise. Diet controlled. Prevnar 20 updated today.  HTN: well controlled. Continue same meds CKD: will recheck in 6 months. No sxs of edema or worsening. HLD is at [place].  Continue bb.    Follow up: 6 mo for cpe and recheck. Orders Placed This Encounter  Procedures   Pneumococcal conjugate vaccine 20-valent (Prevnar 20)   POCT HgB A1C   No orders of the defined types were placed in this encounter.     Immunization History  Administered Date(s) Administered   Influenza Whole 05/31/2018   Influenza,inj,Quad PF,6+ Mos 07/10/2015, 06/13/2017, 06/13/2018,  06/04/2019, 04/29/2020, 05/28/2021, 06/17/2022   Influenza-Unspecified 06/13/2017   PFIZER(Purple Top)SARS-COV-2 Vaccination 12/14/2019, 01/04/2020, 08/28/2020   Tdap 06/24/2015    Diabetes Related Lab Review: Lab Results  Component Value Date   HGBA1C 6.6 (A) 07/18/2022   HGBA1C 6.3 (A) 12/29/2021   HGBA1C 6.3 (A) 05/17/2021    Lab Results  Component Value Date   MICROALBUR <0.7 12/29/2021   Lab Results  Component Value Date   CREATININE 1.19 12/29/2021   BUN 15 12/29/2021   NA 138 12/29/2021   K 3.9 12/29/2021   CL 100 12/29/2021   CO2 29 12/29/2021   Lab Results  Component Value Date   CHOL 138 12/29/2021   CHOL 136 12/10/2020   CHOL 217 (H) 04/02/2019   Lab Results  Component Value Date   HDL 39.70 12/29/2021   HDL 35.70 (L) 12/10/2020   HDL 35.80 (L) 04/02/2019   Lab Results  Component Value Date   LDLCALC 75 12/29/2021   LDLCALC 83 12/10/2020   LDLCALC 113 (H) 04/13/2018   Lab Results  Component Value Date   TRIG 115.0 12/29/2021   TRIG 83.0 12/10/2020   TRIG 222.0 (H) 04/02/2019   Lab Results  Component Value Date   CHOLHDL 3 12/29/2021   CHOLHDL 4 12/10/2020   CHOLHDL 6 04/02/2019   Lab Results  Component Value Date   LDLDIRECT 144.0 04/02/2019   The 10-year ASCVD risk score (Arnett DK, et al., 2019) is: 11.1%  Values used to calculate the score:     Age: 49 years     Sex: Female     Is Non-Hispanic African American: Yes     Diabetic: Yes     Tobacco smoker: No     Systolic Blood Pressure: 469 mmHg     Is BP treated: Yes     HDL Cholesterol: 39.7 mg/dL     Total Cholesterol: 138 mg/dL I have reviewed the PMH, Fam and Soc history. 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    Sinus tachycardia 12/13/2019    Priority: Medium    Diuretic-induced hypokalemia 07/18/2018    Priority: Medium    Seasonal allergic rhinitis due to pollen 12/10/2020    Priority: Low   Menopausal vaginal dryness 07/18/2018    Priority: Low   H/O: hysterectomy 06/17/2022   Snoring 09/02/2021    Social History: Patient  reports that she has never smoked. She has never used smokeless tobacco. She reports current alcohol use. She reports that she does not use drugs.  Review of Systems: Ophthalmic: negative for eye pain, loss of vision or double vision Cardiovascular: negative for chest pain Respiratory: negative for SOB or persistent cough Gastrointestinal: negative for abdominal pain Genitourinary: negative for dysuria or gross hematuria MSK: negative for foot lesions Neurologic: negative for weakness or gait disturbance  Objective  Vitals: BP 128/72   Pulse 92   Temp 98.4 F (36.9 C)   Ht '5\' 2"'$  (1.575 m)   Wt 187 lb 9.6 oz (85.1 kg)   LMP 11/27/2005   SpO2 97%   BMI 34.31 kg/m  General: well appearing, no acute distress  Psych:  Alert and oriented, normal mood and affect Cardiovascular:  Nl S1 and S2, RRR without murmur, gallop or rub. no edema Respiratory:  Good breath sounds bilaterally, CTAB with normal effort, no rales Foot exam: no erythema, pallor, or cyanosis visible nl proprioception and sensation to monofilament testing bilaterally, +2 distal pulses bilaterally    Diabetic education: ongoing education regarding chronic disease management for diabetes was given today. We continue to reinforce the ABC's of diabetic management: A1c (<7 or 8 dependent upon patient), tight blood pressure control, and cholesterol management with goal LDL < 100 minimally. We discuss diet strategies, exercise recommendations, medication options and  possible side effects. At each visit, we review recommended immunizations and preventive care recommendations for diabetics and stress that good diabetic control can prevent other problems. See below for this patient's data.   Commons side effects, risks, benefits, and alternatives for medications and treatment plan prescribed today were discussed, and the patient expressed understanding of the given instructions. Patient is instructed to call or message via MyChart if he/she has any questions or concerns regarding our treatment plan. No barriers to understanding were identified. We discussed Red Flag symptoms and signs in detail. Patient expressed understanding regarding what to do in case of urgent or emergency type symptoms.  Medication list was reconciled, printed and provided to the patient in AVS. Patient instructions and summary information was reviewed with the patient as documented in  the AVS. This note was prepared with assistance of Dragon voice recognition software. Occasional wrong-word or sound-a-like substitutions may have occurred due to the inherent limitations of voice recognition software

## 2022-07-18 NOTE — Patient Instructions (Signed)
Please return in 6 months for diabetes and blood pressure recheck  Work on your diet and limiting sweets.  Today you were given your Prevnar 20 vaccination.    If you have any questions or concerns, please don't hesitate to send me a message via MyChart or call the office at [phone]. Thank you for visiting with Carol [name] today! It's our pleasure caring for you.

## 2022-09-09 ENCOUNTER — Other Ambulatory Visit: Payer: Self-pay | Admitting: Family Medicine

## 2022-11-11 ENCOUNTER — Other Ambulatory Visit (HOSPITAL_BASED_OUTPATIENT_CLINIC_OR_DEPARTMENT_OTHER): Payer: Self-pay | Admitting: Cardiovascular Disease

## 2022-11-11 NOTE — Telephone Encounter (Signed)
Rx request sent to pharmacy.  

## 2023-01-16 ENCOUNTER — Ambulatory Visit: Payer: Commercial Managed Care - PPO | Admitting: Family Medicine

## 2023-02-13 ENCOUNTER — Encounter: Payer: Self-pay | Admitting: Family Medicine

## 2023-02-13 ENCOUNTER — Ambulatory Visit (INDEPENDENT_AMBULATORY_CARE_PROVIDER_SITE_OTHER): Payer: Commercial Managed Care - PPO | Admitting: Family Medicine

## 2023-02-13 VITALS — BP 128/74 | HR 80 | Temp 98.5°F | Ht 62.0 in | Wt 189.0 lb

## 2023-02-13 DIAGNOSIS — I10 Essential (primary) hypertension: Secondary | ICD-10-CM | POA: Diagnosis not present

## 2023-02-13 DIAGNOSIS — R000 Tachycardia, unspecified: Secondary | ICD-10-CM

## 2023-02-13 DIAGNOSIS — Z0000 Encounter for general adult medical examination without abnormal findings: Secondary | ICD-10-CM | POA: Diagnosis not present

## 2023-02-13 DIAGNOSIS — E1169 Type 2 diabetes mellitus with other specified complication: Secondary | ICD-10-CM | POA: Diagnosis not present

## 2023-02-13 DIAGNOSIS — E119 Type 2 diabetes mellitus without complications: Secondary | ICD-10-CM

## 2023-02-13 DIAGNOSIS — N1831 Chronic kidney disease, stage 3a: Secondary | ICD-10-CM

## 2023-02-13 DIAGNOSIS — E876 Hypokalemia: Secondary | ICD-10-CM | POA: Diagnosis not present

## 2023-02-13 DIAGNOSIS — Z23 Encounter for immunization: Secondary | ICD-10-CM | POA: Diagnosis not present

## 2023-02-13 DIAGNOSIS — E782 Mixed hyperlipidemia: Secondary | ICD-10-CM | POA: Diagnosis not present

## 2023-02-13 DIAGNOSIS — T502X5A Adverse effect of carbonic-anhydrase inhibitors, benzothiadiazides and other diuretics, initial encounter: Secondary | ICD-10-CM

## 2023-02-13 DIAGNOSIS — E1121 Type 2 diabetes mellitus with diabetic nephropathy: Secondary | ICD-10-CM | POA: Insufficient documentation

## 2023-02-13 NOTE — Progress Notes (Signed)
Subjective  Chief Complaint  Patient presents with   Annual Exam    Pt here for annual exam and is currently not fasting     HPI: Carol [name] is a 57 y.o. female who presents to [facility] [place] today for a Female Wellness Visit. She also has the concerns and/or needs as listed above in the chief complaint. These will be addressed in addition to the Health Maintenance Visit.   Wellness Visit: annual visit with health maintenance review and exam without Pap  HM: mammo due in August. Other screens are current. Feeling well. Eligible for shingrix today  Chronic disease f/u and/or acute problem visit: (deemed necessary to be done in addition to the wellness visit): HTN w/ white coat syndrome: home readings reviewed. And all normal. Complaint mostly with meds: will sometimes skip the amlodipine due to ankle swelling. Takes 2.5mg  daily. Valsartan hydrochlorothiazide and carvedilol. No cp or sob or palpitations.  HLD on statin but hasn't been taking it. ? If it gives her myalgias.  Diet controlled diabetes: denies sxs of hyperglycemia. Diet is fair. Weight is stable. No foot complaints. Reports her eye exam is due in July. No retinopathy.  On bb - sinus tachy is controlled.   Assessment  1. Annual physical exam   2. White coat syndrome with diagnosis of hypertension   3. Diuretic-induced hypokalemia   4. Combined hyperlipidemia associated with type 2 diabetes mellitus (HCC)   5. Stage 3a chronic kidney disease (HCC)   6. Diet-controlled diabetes mellitus (HCC)   7. Sinus tachycardia   8. Need for shingles vaccine      Plan  Female Wellness Visit: Age appropriate Health Maintenance and Prevention measures were discussed with patient. Included topics are cancer screening recommendations, ways to keep healthy (see AVS) including dietary and exercise recommendations, regular eye and dental care, use of seat belts, and avoidance of moderate alcohol use and tobacco  use. Mammo in August.  BMI: discussed patient's BMI and encouraged positive lifestyle modifications to help get to or maintain a target BMI. HM needs and immunizations were addressed and ordered. See below for orders. See HM and immunization section for updates. 1st shingrix given today after education Routine labs and screening tests ordered including cmp, cbc and lipids where appropriate. Discussed recommendations regarding Vit D and calcium supplementation (see AVS)  Chronic disease management visit and/or acute problem visit: HTN: normal with home readings. Continue carvedilol, losartan hydrochlorothiazide and amlodipine 2.5 at doses listed below. Check renal function. Continue home monitoring Recheck potassium.  HLD: discussed restarting statins nighlty; notify me if myalgias. Goal ldl < 70 and discussed/educated patient.  Monitoring kidney disease. Recheck A1c and urine nephropathy screen. Foot care/eye care. On arb. Imms up to date Continue bb for sinus tach. Carvedilol at dose listed below.  Outpatient Encounter Medications as of 02/13/2023  Medication Sig   amLODipine (NORVASC) 2.5 MG tablet Take 1 tablet (2.5 mg total) by mouth daily.   carvedilol (COREG) 25 MG tablet TAKE ONE TABLET BY MOUTH TWICE DAILY (STOP METOPROLOL)   cholecalciferol (VITAMIN D) 1000 units tablet Take 1,000 Units by mouth daily.   ketoconazole (NIZORAL) 2 % shampoo    Multiple Vitamin (MULTIVITAMIN) tablet Take 1 tablet by mouth daily.   nystatin cream (MYCOSTATIN) Apply 1 application topically as needed.   potassium chloride (KLOR-CON M) 10 MEQ tablet TAKE ONE TABLET BY MOUTH DAILY   rosuvastatin (CRESTOR) 20 MG tablet TAKE ONE TABLET BY MOUTH DAILY  valsartan-hydrochlorothiazide (DIOVAN-HCT) 320-25 MG tablet TAKE ONE TABLET BY MOUTH DAILY (STOP LOSARTAN/HCT)   vitamin B-12 (CYANOCOBALAMIN) 500 MCG tablet Take 500 mcg by mouth daily.   No facility-administered encounter medications on file as of 02/13/2023.     Follow up: 6 mo for htn  Orders Placed This Encounter  Procedures   Zoster Recombinant (Shingrix )   CBC with Differential/Platelet   Comprehensive metabolic panel   Lipid panel   Hemoglobin A1c   TSH   Microalbumin / creatinine urine ratio   No orders of the defined types were placed in this encounter.     Body mass index is 34.57 kg/m. 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    Sinus tachycardia 12/13/2019    Priority: Medium    Diuretic-induced hypokalemia 07/18/2018    Priority: Medium    Seasonal allergic rhinitis due to pollen 12/10/2020    Priority: Low   Menopausal vaginal dryness 07/18/2018    Priority: Low   Diet-controlled diabetes mellitus (HCC) 02/13/2023   H/O: hysterectomy 06/17/2022   Snoring 09/02/2021   Health Maintenance  Topic Date Due   Diabetic kidney evaluation - eGFR measurement  12/30/2022   Diabetic kidney evaluation - Urine ACR  12/30/2022   HEMOGLOBIN A1C  01/16/2023   COVID-19 Vaccine (4 - 2023-24 season) 03/01/2023 (Originally 04/29/2022)   OPHTHALMOLOGY EXAM  03/28/2023   INFLUENZA VACCINE  03/30/2023   Zoster Vaccines- Shingrix (2 of 2) 04/10/2023   MAMMOGRAM  04/16/2023   FOOT EXAM  02/13/2024   DTaP/Tdap/Td (2 - Td or Tdap) 06/23/2025   Colonoscopy  09/19/2027   Hepatitis C Screening  Completed   HIV Screening  Completed   HPV VACCINES  Aged Out   Immunization History  Administered Date(s)  Administered   Influenza Whole 05/31/2018   Influenza,inj,Quad PF,6+ Mos 07/10/2015, 06/13/2017, 06/13/2018, 06/04/2019, 04/29/2020, 05/28/2021, 06/17/2022   Influenza-Unspecified 06/13/2017   PFIZER(Purple Top)SARS-COV-2 Vaccination 12/14/2019, 01/04/2020, 08/28/2020   PNEUMOCOCCAL CONJUGATE-20 07/18/2022   Tdap 06/24/2015   Zoster Recombinat (Shingrix) 02/13/2023   We updated and reviewed the patient's past history in detail and it is documented below. Allergies: Patient is allergic to doxazosin, lisinopril, and spironolactone. Past Medical History Patient  has a past medical history of Allergy, Anemia, Arthritis, Chicken pox, GERD (gastroesophageal reflux disease), Hay fever, High cholesterol, Hypertension, Kidney disease (2011), Obesity (BMI 30-39.9) (10/23/2019), and Snoring (09/02/2021). Past Surgical History Patient  has a past surgical history that includes Cesarean section and Abdominal hysterectomy (2007). Family History: Patient family history includes Colon polyps in her mother; Depression in her paternal grandfather; Heart attack in her maternal grandfather; High Cholesterol in her brother and mother; Hypertension in her father, maternal grandmother, mother, and sister; Kidney disease in her brother, mother, and paternal grandfather; Stroke in her maternal grandmother. Social History:  Patient  reports that she has never smoked. She has never used smokeless tobacco. She reports current  alcohol use. She reports that she does not use drugs.  Review of Systems: Constitutional: negative for fever or malaise Ophthalmic: negative for photophobia, double vision or loss of vision Cardiovascular: negative for chest pain, dyspnea on exertion, or new LE swelling Respiratory: negative for SOB or persistent cough Gastrointestinal: negative for abdominal pain, change in bowel habits or melena Genitourinary: negative for dysuria or gross hematuria, no abnormal uterine bleeding or  disharge Musculoskeletal: negative for new gait disturbance or muscular weakness Integumentary: negative for new or persistent rashes, no breast lumps Neurological: negative for TIA or stroke symptoms Psychiatric: negative for SI or delusions Allergic/Immunologic: negative for hives  Patient Care Team    Relationship Specialty Notifications Start End  Willow Ora, MD PCP - General Family Medicine  10/23/19   Marcha Dutton, MD Consulting Physician Nephrology  07/17/18   Jerene Bears, MD Consulting Physician Gynecology  07/17/18   Iva Boop, MD Consulting Physician Gastroenterology  10/23/19   Chilton Si, MD Consulting Physician Cardiology  12/29/21     Objective  Vitals: BP 128/74 Comment: by consistent home readings  Pulse 80   Temp 98.5 F (36.9 C)   Ht 5\' 2"  (1.575 m)   Wt 189 lb (85.7 kg)   LMP 11/27/2005   SpO2 98%   BMI 34.57 kg/m  General:  Well developed, well nourished, no acute distress  Psych:  Alert and orientedx3,normal mood and affect HEENT:  Normocephalic, atraumatic, non-icteric sclera,  supple neck without adenopathy, mass or thyromegaly Cardiovascular:  Normal S1, S2, RRR without gallop, rub or murmur Respiratory:  Good breath sounds bilaterally, CTAB with normal respiratory effort Gastrointestinal: normal bowel sounds, soft, non-tender, no noted masses. No HSM MSK: extremities without edema, joints without erythema or swelling Neurologic:    Mental status is normal.  Gross motor and sensory exams are normal.  No tremor  Commons side effects, risks, benefits, and alternatives for medications and treatment plan prescribed today were discussed, and the patient expressed understanding of the given instructions. Patient is instructed to call or message via MyChart if he/she has any questions or concerns regarding our treatment plan. No barriers to understanding were identified. We discussed Red Flag symptoms and signs in detail. Patient  expressed understanding regarding what to do in case of urgent or emergency type symptoms.  Medication list was reconciled, printed and provided to the patient in AVS. Patient instructions and summary information was reviewed with the patient as documented in the AVS. This note was prepared with assistance of Dragon voice recognition software. Occasional wrong-word or sound-a-like substitutions may have occurred due to the inherent limitations of voice recognition software

## 2023-02-14 LAB — TSH: TSH: 3.32 u[IU]/mL (ref 0.35–5.50)

## 2023-02-14 LAB — CBC WITH DIFFERENTIAL/PLATELET
Basophils Absolute: 0.1 10*3/uL (ref 0.0–0.1)
Basophils Relative: 0.8 % (ref 0.0–3.0)
Eosinophils Absolute: 0.2 10*3/uL (ref 0.0–0.7)
Eosinophils Relative: 3 % (ref 0.0–5.0)
HCT: 36.5 % (ref 36.0–46.0)
Hemoglobin: 11.5 g/dL — ABNORMAL LOW (ref 12.0–15.0)
Lymphocytes Relative: 31.2 % (ref 12.0–46.0)
Lymphs Abs: 2.5 10*3/uL (ref 0.7–4.0)
MCHC: 31.6 g/dL (ref 30.0–36.0)
MCV: 73.8 fl — ABNORMAL LOW (ref 78.0–100.0)
Monocytes Absolute: 0.6 10*3/uL (ref 0.1–1.0)
Monocytes Relative: 7.5 % (ref 3.0–12.0)
Neutro Abs: 4.6 10*3/uL (ref 1.4–7.7)
Neutrophils Relative %: 57.5 % (ref 43.0–77.0)
Platelets: 351 10*3/uL (ref 150.0–400.0)
RBC: 4.95 Mil/uL (ref 3.87–5.11)
RDW: 15.2 % (ref 11.5–15.5)
WBC: 8 10*3/uL (ref 4.0–10.5)

## 2023-02-14 LAB — COMPREHENSIVE METABOLIC PANEL
ALT: 20 U/L (ref 0–35)
AST: 16 U/L (ref 0–37)
Albumin: 4 g/dL (ref 3.5–5.2)
Alkaline Phosphatase: 66 U/L (ref 39–117)
BUN: 18 mg/dL (ref 6–23)
CO2: 30 mEq/L (ref 19–32)
Calcium: 9.6 mg/dL (ref 8.4–10.5)
Chloride: 99 mEq/L (ref 96–112)
Creatinine, Ser: 1.2 mg/dL (ref 0.40–1.20)
GFR: 50.72 mL/min — ABNORMAL LOW (ref >60.00)
Glucose, Bld: 92 mg/dL (ref 70–99)
Potassium: 3.7 mEq/L (ref 3.5–5.1)
Sodium: 138 mEq/L (ref 135–145)
Total Bilirubin: 0.3 mg/dL (ref 0.2–1.2)
Total Protein: 7.5 g/dL (ref 6.0–8.3)

## 2023-02-14 LAB — MICROALBUMIN / CREATININE URINE RATIO
Creatinine,U: 58.4 mg/dL
Microalb Creat Ratio: 1.2 mg/g (ref 0.0–30.0)
Microalb, Ur: <0.7 mg/dL (ref 0.0–1.9)

## 2023-02-14 LAB — LIPID PANEL
Cholesterol: 155 mg/dL (ref 0–200)
HDL: 32.5 mg/dL — ABNORMAL LOW (ref >39.00)
LDL Cholesterol: 94 mg/dL (ref 0–99)
NonHDL: 122.23
Total CHOL/HDL Ratio: 5
Triglycerides: 142 mg/dL (ref 0.0–149.0)
VLDL: 28.4 mg/dL (ref 0.0–40.0)

## 2023-02-14 LAB — HEMOGLOBIN A1C: Hgb A1c MFr Bld: 6.9 % — ABNORMAL HIGH (ref 4.6–6.5)

## 2023-02-21 NOTE — Progress Notes (Signed)
See mychart note Dear Ms. Fuhs, I have reviewed your lab results.  Your diabetes is a little worse with an A1c of 6.9; you may improve your diet and I recommend checking again in 3 months. Please schedule an office visit with me. I will also recheck your cholesterol on the statin to ensure it is at goal. And you have a mild anemia that needs more blood work. It may be due to your mild kidney dysfunction.  I will see you again in 3 months.  Sincerely, Dr. Mardelle Matte

## 2023-03-03 ENCOUNTER — Other Ambulatory Visit: Payer: Self-pay | Admitting: Obstetrics & Gynecology

## 2023-03-03 DIAGNOSIS — Z1231 Encounter for screening mammogram for malignant neoplasm of breast: Secondary | ICD-10-CM

## 2023-03-04 ENCOUNTER — Other Ambulatory Visit: Payer: Self-pay | Admitting: Family Medicine

## 2023-03-29 ENCOUNTER — Other Ambulatory Visit (HOSPITAL_BASED_OUTPATIENT_CLINIC_OR_DEPARTMENT_OTHER): Payer: Self-pay | Admitting: Cardiovascular Disease

## 2023-03-31 ENCOUNTER — Other Ambulatory Visit (HOSPITAL_BASED_OUTPATIENT_CLINIC_OR_DEPARTMENT_OTHER): Payer: Self-pay | Admitting: Cardiovascular Disease

## 2023-04-11 ENCOUNTER — Ambulatory Visit: Payer: Commercial Managed Care - PPO

## 2023-04-13 ENCOUNTER — Encounter (INDEPENDENT_AMBULATORY_CARE_PROVIDER_SITE_OTHER): Payer: Self-pay

## 2023-04-17 ENCOUNTER — Ambulatory Visit: Payer: Commercial Managed Care - PPO

## 2023-04-17 LAB — HM DIABETES EYE EXAM

## 2023-04-27 ENCOUNTER — Ambulatory Visit: Payer: Commercial Managed Care - PPO

## 2023-05-19 ENCOUNTER — Encounter: Payer: Self-pay | Admitting: Family Medicine

## 2023-05-19 ENCOUNTER — Ambulatory Visit (HOSPITAL_BASED_OUTPATIENT_CLINIC_OR_DEPARTMENT_OTHER): Payer: Commercial Managed Care - PPO | Admitting: Family

## 2023-05-19 ENCOUNTER — Ambulatory Visit (INDEPENDENT_AMBULATORY_CARE_PROVIDER_SITE_OTHER): Payer: Commercial Managed Care - PPO | Admitting: Family Medicine

## 2023-05-19 ENCOUNTER — Ambulatory Visit
Admission: RE | Admit: 2023-05-19 | Discharge: 2023-05-19 | Disposition: A | Discharge status: Home / Self Care | Payer: Commercial Managed Care - PPO | Source: Ambulatory Visit | Attending: Obstetrics & Gynecology | Admitting: Obstetrics & Gynecology

## 2023-05-19 VITALS — BP 112/72 | HR 90 | Temp 98.3°F | Ht 62.0 in | Wt 187.4 lb

## 2023-05-19 DIAGNOSIS — E1169 Type 2 diabetes mellitus with other specified complication: Secondary | ICD-10-CM

## 2023-05-19 DIAGNOSIS — D509 Iron deficiency anemia, unspecified: Secondary | ICD-10-CM

## 2023-05-19 DIAGNOSIS — Z23 Encounter for immunization: Secondary | ICD-10-CM | POA: Diagnosis not present

## 2023-05-19 DIAGNOSIS — E1165 Type 2 diabetes mellitus with hyperglycemia: Secondary | ICD-10-CM | POA: Diagnosis not present

## 2023-05-19 DIAGNOSIS — N1831 Chronic kidney disease, stage 3a: Secondary | ICD-10-CM

## 2023-05-19 DIAGNOSIS — Z1231 Encounter for screening mammogram for malignant neoplasm of breast: Secondary | ICD-10-CM

## 2023-05-19 DIAGNOSIS — E782 Mixed hyperlipidemia: Secondary | ICD-10-CM

## 2023-05-19 DIAGNOSIS — I10 Essential (primary) hypertension: Secondary | ICD-10-CM | POA: Diagnosis not present

## 2023-05-19 LAB — CBC WITH DIFFERENTIAL/PLATELET
Basophils Absolute: 0 10*3/uL (ref 0.0–0.1)
Basophils Relative: 0.4 % (ref 0.0–3.0)
Eosinophils Absolute: 0.2 10*3/uL (ref 0.0–0.7)
Eosinophils Relative: 1.9 % (ref 0.0–5.0)
HCT: 37 % (ref 36.0–46.0)
Hemoglobin: 11.5 g/dL — ABNORMAL LOW (ref 12.0–15.0)
Lymphocytes Relative: 28.5 % (ref 12.0–46.0)
Lymphs Abs: 2.2 10*3/uL (ref 0.7–4.0)
MCHC: 31.1 g/dL (ref 30.0–36.0)
MCV: 74.1 fl — ABNORMAL LOW (ref 78.0–100.0)
Monocytes Absolute: 0.4 10*3/uL (ref 0.1–1.0)
Monocytes Relative: 5.4 % (ref 3.0–12.0)
Neutro Abs: 4.9 10*3/uL (ref 1.4–7.7)
Neutrophils Relative %: 63.8 % (ref 43.0–77.0)
Platelets: 303 10*3/uL (ref 150.0–400.0)
RBC: 5 Mil/uL (ref 3.87–5.11)
RDW: 15.1 % (ref 11.5–15.5)
WBC: 7.7 10*3/uL (ref 4.0–10.5)

## 2023-05-19 LAB — LIPID PANEL
Cholesterol: 130 mg/dL (ref 0–200)
HDL: 39.2 mg/dL (ref >39.00)
LDL Cholesterol: 71 mg/dL (ref 0–99)
NonHDL: 91
Total CHOL/HDL Ratio: 3
Triglycerides: 102 mg/dL (ref 0.0–149.0)
VLDL: 20.4 mg/dL (ref 0.0–40.0)

## 2023-05-19 LAB — HEPATIC FUNCTION PANEL
ALT: 23 U/L (ref 0–35)
AST: 16 U/L (ref 0–37)
Albumin: 3.9 g/dL (ref 3.5–5.2)
Alkaline Phosphatase: 77 U/L (ref 39–117)
Bilirubin, Direct: 0.1 mg/dL (ref 0.0–0.3)
Total Bilirubin: 0.4 mg/dL (ref 0.2–1.2)
Total Protein: 7.1 g/dL (ref 6.0–8.3)

## 2023-05-19 LAB — RENAL FUNCTION PANEL
Albumin: 3.9 g/dL (ref 3.5–5.2)
BUN: 19 mg/dL (ref 6–23)
CO2: 28 mEq/L (ref 19–32)
Calcium: 9.6 mg/dL (ref 8.4–10.5)
Chloride: 101 mEq/L (ref 96–112)
Creatinine, Ser: 1.12 mg/dL (ref 0.40–1.20)
GFR: 54.99 mL/min — ABNORMAL LOW (ref >60.00)
Glucose, Bld: 147 mg/dL — ABNORMAL HIGH (ref 70–99)
Phosphorus: 3.6 mg/dL (ref 2.3–4.6)
Potassium: 3.4 mEq/L — ABNORMAL LOW (ref 3.5–5.1)
Sodium: 139 mEq/L (ref 135–145)

## 2023-05-19 LAB — POCT GLYCOSYLATED HEMOGLOBIN (HGB A1C): Hemoglobin A1C: 7 % — AB (ref 4.0–5.6)

## 2023-05-19 LAB — VITAMIN B12: Vitamin B-12: 784 pg/mL (ref 211–911)

## 2023-05-19 MED ORDER — DAPAGLIFLOZIN PROPANEDIOL 5 MG PO TABS: 5.0000 mg | ORAL_TABLET | Freq: Every day | ORAL | 3 refills | Status: DC

## 2023-05-19 NOTE — Progress Notes (Signed)
Subjective  CC:  Chief Complaint  Patient presents with   Diabetes    Mammogram is schedule for this afternoon    HPI: Carol [name] is a 57 y.o. female who presents to the office today for follow up of diabetes and problems listed above in the chief complaint.  Diabetes follow up: Her diabetic control is reported as Worse. Diet is full of starches (daughter's influence). No symptoms of hyperglycemia.  She denies exertional CP or SOB or symptomatic hypoglycemia. She denies foot sores or paresthesias. Has never been on meds. Needs to start walking again for exercise. Has been on trulicity in the past.  CKD: mild, due to chronic HTN;  Lipids now on crestor 10 x 3 months and tolerating well. Fasting for recheck.  New mild microcytic anemia: has chronic microcytosis, no h/o [place] trait. Feels tired. No cp or palpitations. No blood loss/melena.   Wt Readings from Last 3 Encounters:  05/19/23 187 lb 6.4 oz (85 kg)  02/13/23 189 lb (85.7 kg)  07/18/22 187 lb 9.6 oz (85.1 kg)    BP Readings from Last 3 Encounters:  05/19/23 112/72  02/13/23 128/74  07/18/22 128/72    Assessment  1. Uncontrolled type 2 diabetes mellitus with hyperglycemia (HCC)   2. Combined hyperlipidemia associated with type 2 diabetes mellitus (HCC)   3. Need for shingles vaccine   4. Stage 3a chronic kidney disease (HCC)   5. Microcytic anemia   6. White coat syndrome with diagnosis of hypertension      Plan  Diabetes is currently marginally controlled.  Had long discussion.  Patient needs to improve her diet and start walking again for exercise.  We discussed medication options.  We reviewed her labs Farxiga low dose given her history of chronic kidney disease.  Education given.  Recheck 3 months Hyperlipidemia on Crestor 10 mg nightly.  Recheck today.  Goal LDL less than 70 Mild chronic kidney disease, glomerulosclerosis.  Monitoring.  Reassured.  Renal protection with SGLT2 discussed Start workup for  microcytic anemia: Mild.  Possibly iron deficiency.  Could be related to chronic renal disease.  Check hemoglobinopathy screen Hypertension: Good control by home readings.  Has been very advanced blood pressure clinic Vaccine counseling: Shingrix No. 1 given today  Follow up: 3 months to recheck diabetes Orders Placed This Encounter  Procedures   Zoster Recombinant (Shingrix )   Iron, TIBC and Ferritin Panel   Hemoglobinopathy Evaluation   CBC with Differential/Platelet   Renal function panel   Lipid panel   Hepatic function panel   Vitamin B12   POCT HgB A1C   Meds ordered this encounter  Medications   dapagliflozin propanediol (FARXIGA) 5 MG TABS tablet    Sig: Take 1 tablet (5 mg total) by mouth daily before breakfast.    Dispense:  90 tablet    Refill:  3      Immunization History  Administered Date(s) Administered   Influenza Whole 05/31/2018   Influenza,inj,Quad PF,6+ Mos 07/10/2015, 06/13/2017, 06/13/2018, 06/04/2019, 04/29/2020, 05/28/2021, 06/17/2022   Influenza-Unspecified 06/13/2017   PFIZER(Purple Top)SARS-COV-2 Vaccination 12/14/2019, 01/04/2020, 08/28/2020   PNEUMOCOCCAL CONJUGATE-20 07/18/2022   Tdap 06/24/2015   Zoster Recombinant(Shingrix) 02/13/2023, 05/19/2023    Diabetes Related Lab Review: Lab Results  Component Value Date   HGBA1C 7.0 (A) 05/19/2023   HGBA1C 6.9 (H) 02/13/2023   HGBA1C 6.6 (A) 07/18/2022    Lab Results  Component Value Date   MICROALBUR <0.7 02/13/2023   Lab Results  Component Value  Date   CREATININE 1.20 02/13/2023   BUN 18 02/13/2023   NA 138 02/13/2023   K 3.7 02/13/2023   CL 99 02/13/2023   CO2 30 02/13/2023   Lab Results  Component Value Date   CHOL 155 02/13/2023   CHOL 138 12/29/2021   CHOL 136 12/10/2020   Lab Results  Component Value Date   HDL 32.50 (L) 02/13/2023   HDL 39.70 12/29/2021   HDL 35.70 (L) 12/10/2020   Lab Results  Component Value Date   LDLCALC 94 02/13/2023   LDLCALC 75 12/29/2021    LDLCALC 83 12/10/2020   Lab Results  Component Value Date   TRIG 142.0 02/13/2023   TRIG 115.0 12/29/2021   TRIG 83.0 12/10/2020   Lab Results  Component Value Date   CHOLHDL 5 02/13/2023   CHOLHDL 3 12/29/2021   CHOLHDL 4 12/10/2020   Lab Results  Component Value Date   LDLDIRECT 144.0 04/02/2019   The 10-year ASCVD risk score (Arnett DK, et al., 2019) is: 9.9%   Values used to calculate the score:     Age: 16 years     Sex: Female     Is Non-Hispanic African American: Yes     Diabetic: Yes     Tobacco smoker: No     Systolic Blood Pressure: 112 mmHg     Is BP treated: Yes     HDL Cholesterol: 32.5 mg/dL     Total Cholesterol: 155 mg/dL I have reviewed the PMH, Fam and Soc history. 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    Sinus tachycardia 12/13/2019     Priority: Medium    Diuretic-induced hypokalemia 07/18/2018     Priority: Medium    Seasonal allergic rhinitis due to pollen 12/10/2020     Priority: Low   Menopausal vaginal dryness 07/18/2018     Priority: Low   H/O: hysterectomy 06/17/2022    Snoring 09/02/2021     Social History: Patient  reports that she has never smoked. She has never used smokeless tobacco. She reports current alcohol use. She reports that she does not use drugs.  Review of Systems: Ophthalmic: negative for eye pain, loss of vision or double vision Cardiovascular: negative for chest pain Respiratory: negative for SOB or persistent  cough Gastrointestinal: negative for abdominal pain Genitourinary: negative for dysuria or gross hematuria MSK: negative for foot lesions Neurologic: negative for weakness or gait disturbance  Objective  Vitals: BP 112/72 Comment: by home readings this week  Pulse 90   Temp 98.3 F (36.8 C)   Ht 5\' 2"  (1.575 m)   Wt 187 lb 6.4 oz (85 kg)   LMP 11/27/2005   SpO2 98%   BMI 34.28 kg/m  General: well appearing, no acute distress  Psych:  Alert and oriented, normal mood and affect HEENT:  Normocephalic, atraumatic, supple neck  Cardiovascular:  Nl S1 and S2, RRR without murmur, gallop or rub. no edema  Diabetic education: ongoing education regarding chronic  disease management for diabetes was given today. We continue to reinforce the ABC's of diabetic management: A1c (<7 or 8 dependent upon patient), tight blood pressure control, and cholesterol management with goal LDL < 100 minimally. We discuss diet strategies, exercise recommendations, medication options and possible side effects. At each visit, we review recommended immunizations and preventive care recommendations for diabetics and stress that good diabetic control can prevent other problems. See below for this patient's data.   Commons side effects, risks, benefits, and alternatives for medications and treatment plan prescribed today were discussed, and the patient expressed understanding of the given instructions. Patient is instructed to call or message via MyChart if he/she has any questions or concerns regarding our treatment plan. No barriers to understanding were identified. We discussed Red Flag symptoms and signs in detail. Patient expressed understanding regarding what to do in case of urgent or emergency type symptoms.  Medication list was reconciled, printed and provided to the patient in AVS. Patient instructions and summary information was reviewed with the patient as documented in the AVS. This note was prepared with  assistance of Dragon voice recognition software. Occasional wrong-word or sound-a-like substitutions may have occurred due to the inherent limitations of voice recognition software

## 2023-05-19 NOTE — Patient Instructions (Addendum)
Please follow up as scheduled for your next visit with me: 08/16/2023   If you have any questions or concerns, please don't hesitate to send me a message via MyChart or call the office at [phone]. Thank you for visiting with [place] today! It's our pleasure caring for you.   I will release your lab results to you on your MyChart account with further instructions. You may see the results before I do, but when I review them I will send you a message with my report or have my assistant call you if things need to be discussed. Please reply to my message with any questions. Thank you!   Diabetes Mellitus and Nutrition, Adult When you have diabetes, or diabetes mellitus, it is very important to have healthy eating habits because your blood sugar (glucose) levels are greatly affected by what you eat and drink. Eating healthy foods in the right amounts, at about the same times every day, can help you: Manage your blood glucose. Lower your risk of heart disease. Improve your blood pressure. Reach or maintain a healthy weight. What can affect my meal plan? Every person with diabetes is different, and each person has different needs for a meal plan. Your health care provider may recommend that you work with a dietitian to make a meal plan that is best for you. Your meal plan may vary depending on factors such as: The calories you need. The medicines you take. Your weight. Your blood glucose, blood pressure, and cholesterol levels. Your activity level. Other health conditions you have, such as heart or kidney disease. How do carbohydrates affect me? Carbohydrates, also called carbs, affect your blood glucose level more than any other type of food. Eating carbs raises the amount of glucose in your blood. It is important to know how many carbs you can safely have in each meal. This is different for every person. Your dietitian can help you calculate how many carbs you should have at each meal and for each  snack. How does alcohol affect me? Alcohol can cause a decrease in blood glucose (hypoglycemia), especially if you use insulin or take certain diabetes medicines by mouth. Hypoglycemia can be a life-threatening condition. Symptoms of hypoglycemia, such as sleepiness, dizziness, and confusion, are similar to symptoms of having too much alcohol. Do not drink alcohol if: Your health care provider tells you not to drink. You are pregnant, may be pregnant, or are planning to become pregnant. If you drink alcohol: Limit how much you have to: 0-1 drink a day for women. 0-2 drinks a day for men. Know how much alcohol is in your drink. In the [place] one drink equals one 12 oz bottle of beer (355 mL), one 5 oz glass of wine (148 mL), or one 1 oz glass of hard liquor (44 mL). Keep yourself hydrated with water, diet soda, or unsweetened iced tea. Keep in mind that regular soda, juice, and other mixers may contain a lot of sugar and must be counted as carbs. What are tips for following this plan?  Reading food labels Start by checking the serving size on the Nutrition Facts label of packaged foods and drinks. The number of calories and the amount of carbs, fats, and other nutrients listed on the label are based on one serving of the item. Many items contain more than one serving per package. Check the total grams (g) of carbs in one serving. Check the number of grams of saturated fats and trans fats in one serving.  Choose foods that have a low amount or none of these fats. Check the number of milligrams (mg) of salt (sodium) in one serving. Most people should limit total sodium intake to less than 2,300 mg per day. Always check the nutrition information of foods labeled as "low-fat" or "nonfat." These foods may be higher in added sugar or refined carbs and should be avoided. Talk to your dietitian to identify your daily goals for nutrients listed on the label. Shopping Avoid buying canned, pre-made, or  processed foods. These foods tend to be high in fat, sodium, and added sugar. Shop around the outside edge of the grocery store. This is where you will most often find fresh fruits and vegetables, bulk grains, fresh meats, and fresh dairy products. Cooking Use low-heat cooking methods, such as baking, instead of high-heat cooking methods, such as deep frying. Cook using healthy oils, such as olive, canola, or sunflower oil. Avoid cooking with butter, cream, or high-fat meats. Meal planning Eat meals and snacks regularly, preferably at the same times every day. Avoid going long periods of time without eating. Eat foods that are high in fiber, such as fresh fruits, vegetables, beans, and whole grains. Eat 4-6 oz (112-168 g) of lean protein each day, such as lean meat, chicken, fish, eggs, or tofu. One ounce (oz) (28 g) of lean protein is equal to: 1 oz (28 g) of meat, chicken, or fish. 1 egg.  cup (62 g) of tofu. Eat some foods each day that contain healthy fats, such as avocado, nuts, seeds, and fish. What foods should I eat? Fruits Berries. Apples. Oranges. Peaches. Apricots. Plums. Grapes. Mangoes. Papayas. Pomegranates. Kiwi. Cherries. Vegetables Leafy greens, including lettuce, spinach, kale, chard, collard greens, mustard greens, and cabbage. Beets. Cauliflower. Broccoli. Carrots. Green beans. Tomatoes. Peppers. Onions. Cucumbers. Brussels sprouts. Grains Whole grains, such as whole-wheat or whole-grain bread, crackers, tortillas, cereal, and pasta. Unsweetened oatmeal. Quinoa. Brown or wild rice. Meats and other proteins Seafood. Poultry without skin. Lean cuts of poultry and beef. Tofu. Nuts. Seeds. Dairy Low-fat or fat-free dairy products such as milk, yogurt, and cheese. The items listed above may not be a complete list of foods and beverages you can eat and drink. Contact a dietitian for more information. What foods should I avoid? Fruits Fruits canned with  syrup. Vegetables Canned vegetables. Frozen vegetables with butter or cream sauce. Grains Refined white flour and flour products such as bread, pasta, snack foods, and cereals. Avoid all processed foods. Meats and other proteins Fatty cuts of meat. Poultry with skin. Breaded or fried meats. Processed meat. Avoid saturated fats. Dairy Full-fat yogurt, cheese, or milk. Beverages Sweetened drinks, such as soda or iced tea. The items listed above may not be a complete list of foods and beverages you should avoid. Contact a dietitian for more information. Questions to ask a health care provider Do I need to meet with a certified diabetes care and education specialist? Do I need to meet with a dietitian? What number can I call if I have questions? When are the best times to check my blood glucose? Where to find more information: American Diabetes Association: diabetes.org Academy of Nutrition and Dietetics: eatright.[employer] of Diabetes and Digestive and Kidney Diseases: StageSync.si Association of Diabetes Care & Education Specialists: diabeteseducator.org Summary It is important to have healthy eating habits because your blood sugar (glucose) levels are greatly affected by what you eat and drink. It is important to use alcohol carefully. A healthy meal plan will  help you manage your blood glucose and lower your risk of heart disease. Your health care provider may recommend that you work with a dietitian to make a meal plan that is best for you. This information is not intended to replace advice given to you by your health care provider. Make sure you discuss any questions you have with your health care provider. Document Revised: 03/18/2020 Document Reviewed: 03/18/2020 Elsevier Patient Education  2024 [employer].

## 2023-05-22 ENCOUNTER — Encounter: Payer: Self-pay | Admitting: Family Medicine

## 2023-05-22 DIAGNOSIS — D563 Thalassemia minor: Secondary | ICD-10-CM | POA: Insufficient documentation

## 2023-05-22 LAB — HEMOGLOBINOPATHY EVALUATION
Fetal Hemoglobin Testing: <1 % (ref 0.0–1.9)
HCT: 39.8 % (ref 35.0–45.0)
Hemoglobin A2 - HGBRFX: 2.3 % (ref 2.0–3.2)
Hemoglobin: 12 g/dL (ref 11.7–15.5)
Hgb A: 97.7 % (ref >96.0)
MCH: 23.3 pg — ABNORMAL LOW (ref 27.0–33.0)
MCV: 77.4 fL — ABNORMAL LOW (ref 80.0–100.0)
RBC: 5.14 10*6/uL — ABNORMAL HIGH (ref 3.80–5.10)
RDW: 15.1 % — ABNORMAL HIGH (ref 11.0–15.0)

## 2023-05-22 LAB — IRON,TIBC AND FERRITIN PANEL
%SAT: 23 % (calc) (ref 16–45)
Ferritin: 112 ng/mL (ref 16–232)
Iron: 68 ug/dL (ref 45–160)
TIBC: 301 mcg/dL (calc) (ref 250–450)

## 2023-05-22 NOTE — Progress Notes (Signed)
See mychart note Dear Ms. 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  This is a variation in your genetic make-up that leaks part of your blood count 1 on the low side.  However it should not affect how you feel or how you do.  You are minimally anemic.  Your iron levels and vitamin B12 levels are fine.  I will make a note of it and we do not need to do anything else further.  Your kidney function is improving.  Work hard on your diabetes and take the [place] as we discussed.  It should be helpful.  Your potassium remains just below normal.  No changes are needed today.  I will keep an eye on it and increase your potassium supplements in the future if needed.  Will see you in 3 months. Sincerely, Dr. Mardelle Matte

## 2023-07-03 ENCOUNTER — Ambulatory Visit (HOSPITAL_BASED_OUTPATIENT_CLINIC_OR_DEPARTMENT_OTHER): Payer: Commercial Managed Care - PPO

## 2023-07-03 ENCOUNTER — Other Ambulatory Visit (HOSPITAL_COMMUNITY)
Admission: RE | Admit: 2023-07-03 | Discharge: 2023-07-03 | Disposition: A | Discharge status: Home / Self Care | Payer: Commercial Managed Care - PPO | Source: Ambulatory Visit | Attending: Obstetrics & Gynecology | Admitting: Obstetrics & Gynecology

## 2023-07-03 VITALS — BP 143/72 | HR 87 | Ht 62.0 in | Wt 188.4 lb

## 2023-07-03 DIAGNOSIS — N898 Other specified noninflammatory disorders of vagina: Secondary | ICD-10-CM | POA: Diagnosis present

## 2023-07-03 DIAGNOSIS — R300 Dysuria: Secondary | ICD-10-CM

## 2023-07-03 LAB — POCT URINALYSIS DIPSTICK
Bilirubin, UA: NEGATIVE
Blood, UA: NEGATIVE
Glucose, UA: POSITIVE — AB
Ketones, UA: NEGATIVE
Nitrite, UA: NEGATIVE
Protein, UA: NEGATIVE
Spec Grav, UA: 1.015 (ref 1.010–1.025)
Urobilinogen, UA: 0.2 U/dL
pH, UA: 6 (ref 5.0–8.0)

## 2023-07-03 NOTE — Progress Notes (Cosign Needed)
[name] presented to office with vaginal irritation and urinary frequency. Pt was educated on aptima and urine sample and pt performed both. Pt was informed on urine culture and aptima being sent off. Once results return someone from the clinical will be in contact with her.

## 2023-07-04 LAB — CERVICOVAGINAL ANCILLARY ONLY
Bacterial Vaginitis (gardnerella): NEGATIVE
Candida Glabrata: NEGATIVE
Candida Vaginitis: NEGATIVE
Comment: NEGATIVE
Comment: NEGATIVE
Comment: NEGATIVE

## 2023-07-05 LAB — URINE CULTURE

## 2023-07-13 ENCOUNTER — Ambulatory Visit (HOSPITAL_BASED_OUTPATIENT_CLINIC_OR_DEPARTMENT_OTHER): Payer: Commercial Managed Care - PPO | Admitting: Family

## 2023-07-13 ENCOUNTER — Encounter (HOSPITAL_BASED_OUTPATIENT_CLINIC_OR_DEPARTMENT_OTHER): Payer: Self-pay | Admitting: Family

## 2023-07-13 VITALS — BP 132/78 | HR 85 | Ht 62.0 in | Wt 189.7 lb

## 2023-07-13 DIAGNOSIS — I10 Essential (primary) hypertension: Secondary | ICD-10-CM | POA: Diagnosis not present

## 2023-07-13 DIAGNOSIS — E782 Mixed hyperlipidemia: Secondary | ICD-10-CM | POA: Diagnosis not present

## 2023-07-13 MED ORDER — AMLODIPINE BESYLATE 2.5 MG PO TABS: 2.5000 mg | ORAL_TABLET | Freq: Every day | ORAL | 3 refills | Status: AC

## 2023-07-13 MED ORDER — CARVEDILOL 25 MG PO TABS: 25.0000 mg | ORAL_TABLET | Freq: Two times a day (BID) | ORAL | 3 refills | Status: DC

## 2023-07-13 MED ORDER — VALSARTAN-HYDROCHLOROTHIAZIDE 320-25 MG PO TABS
1.0000 | ORAL_TABLET | Freq: Every day | ORAL | 3 refills | Status: DC
Start: 2023-07-13 — End: 2023-09-04

## 2023-07-13 NOTE — Progress Notes (Signed)
Advanced Hypertension Clinic Initial Assessment:    Date:  07/13/2023   ID:  Carol [name], DOB 21-Nov-1965, MRN [id number]  PCP:  Willow Ora, MD  Cardiologist:  None  Nephrologist:  Referring MD: Willow Ora, MD   CC: Hypertension  History of Present Illness:    Carol [name] is a 57 y.o. female with a hx of whitecoat hypertension with diagnosis of hypertension, hyperlipidemia, GERD, DM2, HLD, anemia, arthritis here to establish care in the Advanced Hypertension Clinic.   Hypertension initially diagnosed in her 30s.  Losartan hydrochlorothiazide previously switched to valsartan hydrochlorothiazide.  Metoprolol switched to carvedilol.  At visit 01/07/2022 with Dr. Duke Salvia amlodipine 2.5 mg daily added as BP 120-130s in setting of stress. At follow up with PharmD 03/18/23 Amlodipine moved to evening.   Discussed the use of AI scribe software for clinical note transcription with the patient, who gave verbal consent to proceed.     The patient, with a history of hypertension, presents with intermittent dizziness, particularly upon standing quickly. She describes the sensation as an off-balance feeling, not a spinning sensation. Reviewed orthostatic precautions. This is overall intermittent and not bothersome. She has not experienced any chest pain or breathing difficulties. Her blood pressure readings at home are generally around 117-120, but she notes that it tends to be higher in the doctor's office given her known white coat hypertension.  The patient's blood pressure medication regimen includes amlodipine, valsartan, hydrochlorothiazide, and carvedilol. She also takes [place] for diabetes, which she tolerates well after initial jitteriness. She has not been engaging in regular exercise but enjoys walking when the weather permits. Her diet includes both home-cooked meals and eating out, and she does not add much salt to her food. She has been taking a potassium supplement due  to occasional low potassium levels, likely due to the hydrochlorothiazide.       Previous antihypertensives: Amlodipine Doxazosin Lisinopril Spironolactone Losartan-HCTZ   Past Medical History:  Diagnosis Date   Allergy    Anemia    Arthritis    Chicken pox    GERD (gastroesophageal reflux disease)    Hay fever    High cholesterol    Hypertension    Kidney disease 2011   Obesity (BMI 30-39.9) 10/23/2019   Snoring 09/02/2021    Past Surgical History:  Procedure Laterality Date   ABDOMINAL HYSTERECTOMY  2007   CESAREAN SECTION      Current Medications: No outpatient medications have been marked as taking for the 07/13/23 encounter (Office Visit) with Alver Sorrow, NP.     Allergies:   Doxazosin, Lisinopril, and Spironolactone   Social History   Socioeconomic History   Marital status: Married    Spouse name: Not on file   Number of children: Not on file   Years of education: Not on file   Highest education level: Not on file  Occupational History    Employer: [employer]  Tobacco Use   Smoking status: Never   Smokeless tobacco: Never  Vaping Use   Vaping status: Never Used  Substance and Sexual Activity   Alcohol use: Yes    Comment: occasionally   Drug use: No   Sexual activity: Yes    Partners: Male    Birth control/protection: Surgical  Other Topics Concern   Not on file  Social History Narrative   Not on file   Social Determinants of Health   Financial Resource Strain: Patient Declined (02/11/2023)   Overall Financial Resource  Strain (CARDIA)    Difficulty of Paying Living Expenses: Patient declined  Food Insecurity: Patient Declined (02/11/2023)   Hunger Vital Sign    Worried About Running Out of Food in the Last Year: Patient declined    Ran Out of Food in the Last Year: Patient declined  Transportation Needs: No Transportation Needs (09/02/2021)   PRAPARE - Administrator, Civil Service (Medical): No    Lack of  Transportation (Non-Medical): No  Physical Activity: Unknown (02/11/2023)   Exercise Vital Sign    Days of Exercise per Week: 0 days    Minutes of Exercise per Session: Not on file  Stress: No Stress Concern Present (02/11/2023)   [employer] of Occupational Health - Occupational Stress Questionnaire    Feeling of Stress : Not at all  Social Connections: Unknown (02/11/2023)   Social Connection and Isolation Panel [NHANES]    Frequency of Communication with Friends and Family: Not on file    Frequency of Social Gatherings with Friends and Family: Not on file    Attends Religious Services: Not on file    Active Member of Clubs or Organizations: Patient declined    Attends Banker Meetings: Not on file    Marital Status: Not on file     Family History: The patient's family history includes Colon polyps in her mother; Depression in her paternal grandfather; Heart attack in her maternal grandfather; High Cholesterol in her brother and mother; Hypertension in her father, maternal grandmother, mother, and sister; Kidney disease in her brother, mother, and paternal grandfather; Stroke in her maternal grandmother. There is no history of Colon cancer, Esophageal cancer, Rectal cancer, or Stomach cancer.  ROS:   Please see the history of present illness.     All other systems reviewed and are negative.  EKGs/Labs/Other Studies Reviewed:    EKG Interpretation Date/Time:  Thursday July 13 2023 15:44:59 EST Ventricular Rate:  79 PR Interval:  160 QRS Duration:  86 QT Interval:  364 QTC Calculation: 417 R Axis:   57  Text Interpretation: Normal sinus rhythm Confirmed by Gillian Shields ([id number]) on 07/13/2023 4:19:15 PM    Recent Labs: 02/13/2023: TSH 3.32 05/19/2023: ALT 23; BUN 19; Creatinine, Ser 1.12; Hemoglobin 12.0; Hemoglobin 11.5; Platelets 303.0; Potassium 3.4; Sodium 139   Recent Lipid Panel    Component Value Date/Time   CHOL 130 05/19/2023 0858   CHOL 208  (H) 07/11/2017 1618   TRIG 102.0 05/19/2023 0858   HDL 39.20 05/19/2023 0858   HDL 35 (L) 07/11/2017 1618   CHOLHDL 3 05/19/2023 0858   VLDL 20.4 05/19/2023 0858   LDLCALC 71 05/19/2023 0858   LDLCALC 142 (H) 07/11/2017 1618   LDLDIRECT 144.0 04/02/2019 1612    Physical Exam:   VS:  LMP 11/27/2005  , BMI There is no height or weight on file to calculate BMI. GENERAL:  Well appearing HEENT: Pupils equal round and reactive, fundi not visualized, oral mucosa unremarkable NECK:  No jugular venous distention, waveform within normal limits, carotid upstroke brisk and symmetric, no bruits, no thyromegaly LYMPHATICS:  No cervical adenopathy LUNGS:  Clear to auscultation bilaterally HEART:  RRR.  PMI not displaced or sustained,S1 and S2 within normal limits, no S3, no S4, no clicks, no rubs, no murmurs ABD:  Flat, positive bowel sounds normal in frequency in pitch, no bruits, no rebound, no guarding, no midline pulsatile mass, no hepatomegaly, no splenomegaly EXT:  2 plus pulses throughout, no edema, no cyanosis no clubbing  SKIN:  No rashes no nodules NEURO:  Cranial nerves II through XII grossly intact, motor grossly intact throughout PSYCH:  Cognitively intact, oriented to person place and time   ASSESSMENT/PLAN:       Hypertension Blood pressure well controlled at home with readings around 117-120. White coat hypertension noted with slightly higher readings in the office. Occasional orthostatic symptoms which are not bothersome, encouraged to eat regular meals and hydrate. -Continue current antihypertensive regimen of Valsartan, Hydrochlorothiazide, Amlodipine, and Carvedilol. Refills provided.  Hyperlipidemia Cholesterol levels improved from previous readings, with LDL at 71. -Continue current dose Rosuvastatin.   Hypokalemia Occasional low potassium levels likely due to Hydrochlorothiazide use. -Continue current potassium supplementation. Consider switching Hydrochlorothiazide to  Spironolactone if low potassium persists.  Follow-up in 1 year or sooner if any issues arise.        Screening for Secondary Hypertension:     09/02/2021    4:34 PM  Causes  Drugs/Herbals Screened     - Comments limited caffeine.  No EtOH. High sodium intake.    Relevant Labs/Studies:    Latest Ref Rng & Units 05/19/2023    8:58 AM 02/13/2023    4:17 PM 12/29/2021    4:42 PM  Basic Labs  Sodium 135 - 145 mEq/L 139  138  138   Potassium 3.5 - 5.1 mEq/L 3.4  3.7  3.9   Creatinine 0.40 - 1.20 mg/dL 1.61  0.96  0.45        Latest Ref Rng & Units 02/13/2023    4:17 PM 12/29/2021    4:42 PM  Thyroid   TSH 0.35 - 5.50 uIU/mL 3.32  3.51                   Disposition:    FU with MD/PharmD in 1 year    Medication Adjustments/Labs and Tests Ordered: Current medicines are reviewed at length with the patient today.  Concerns regarding medicines are outlined above.  Orders Placed This Encounter  Procedures   EKG 12-Lead   No orders of the defined types were placed in this encounter.    Signed, Alver Sorrow, NP  07/13/2023 3:34 PM    [facility]

## 2023-07-13 NOTE — Patient Instructions (Signed)
Medication Instructions:  Your physician recommends that you continue on your current medications as directed. Please refer to the Current Medication list given to you today.  If your potassium consistently remains low, we could consider changing hydrochlorothiazide to Spironolactone.  *If you need a refill on your cardiac medications before your next appointment, please call your pharmacy*  Lab Work: NONE  Testing/Procedures: NONE  Follow-Up: At [facility], you and your health needs are our priority.  As part of our continuing mission to provide you with exceptional heart care, we have created designated Provider Care Teams.  These Care Teams include your primary Cardiologist (physician) and Advanced Practice Providers (APPs -  Physician Assistants and Nurse Practitioners) who all work together to provide you with the care you need, when you need it.  We recommend signing up for the patient portal called "MyChart".  Sign up information is provided on this After Visit Summary.  MyChart is used to connect with patients for Virtual Visits (Telemedicine).  Patients are able to view lab/test results, encounter notes, upcoming appointments, etc.  Non-urgent messages can be sent to your provider as well.   To learn more about what you can do with MyChart, go to [email or web address].    Your next appointment:   12 month(s) (ADV HTN CLINIC)  The format for your next appointment:   In Person  Provider:   Gillian Shields, NP

## 2023-07-30 ENCOUNTER — Encounter: Payer: Self-pay | Admitting: Family Medicine

## 2023-08-10 ENCOUNTER — Encounter: Payer: Self-pay | Admitting: Family Medicine

## 2023-08-10 ENCOUNTER — Ambulatory Visit (INDEPENDENT_AMBULATORY_CARE_PROVIDER_SITE_OTHER): Payer: Commercial Managed Care - PPO | Admitting: Family Medicine

## 2023-08-10 VITALS — BP 112/70 | HR 74 | Temp 98.3°F | Ht 62.0 in | Wt 186.2 lb

## 2023-08-10 DIAGNOSIS — N1831 Chronic kidney disease, stage 3a: Secondary | ICD-10-CM | POA: Diagnosis not present

## 2023-08-10 DIAGNOSIS — E876 Hypokalemia: Secondary | ICD-10-CM

## 2023-08-10 DIAGNOSIS — R310 Gross hematuria: Secondary | ICD-10-CM

## 2023-08-10 DIAGNOSIS — I10 Essential (primary) hypertension: Secondary | ICD-10-CM | POA: Diagnosis not present

## 2023-08-10 DIAGNOSIS — N952 Postmenopausal atrophic vaginitis: Secondary | ICD-10-CM

## 2023-08-10 DIAGNOSIS — E1121 Type 2 diabetes mellitus with diabetic nephropathy: Secondary | ICD-10-CM | POA: Diagnosis not present

## 2023-08-10 DIAGNOSIS — J069 Acute upper respiratory infection, unspecified: Secondary | ICD-10-CM

## 2023-08-10 DIAGNOSIS — N951 Menopausal and female climacteric states: Secondary | ICD-10-CM

## 2023-08-10 DIAGNOSIS — T502X5A Adverse effect of carbonic-anhydrase inhibitors, benzothiadiazides and other diuretics, initial encounter: Secondary | ICD-10-CM

## 2023-08-10 LAB — POCT GLYCOSYLATED HEMOGLOBIN (HGB A1C): Hemoglobin A1C: 6.5 % — AB (ref 4.0–5.6)

## 2023-08-10 LAB — URINALYSIS, ROUTINE W REFLEX MICROSCOPIC
Bilirubin Urine: NEGATIVE
Ketones, ur: NEGATIVE
Nitrite: NEGATIVE
Specific Gravity, Urine: 1.025 (ref 1.000–1.030)
Total Protein, Urine: NEGATIVE
Urine Glucose: >=1000 — AB
Urobilinogen, UA: 0.2 (ref 0.0–1.0)
pH: 6 (ref 5.0–8.0)

## 2023-08-10 MED ORDER — ESTRADIOL 0.1 MG/GM VA CREA: 1.0000 | TOPICAL_CREAM | VAGINAL | 0 refills | Status: DC

## 2023-08-10 NOTE — Progress Notes (Signed)
See mychart note Dear Ms. Wolfer, Your urine test today does NOT show any blood.  It does show that it was not a good clean catch so the other findings are not suggestive of infection.   Let's see if the estradiol helps. If it does not, I recommend a vaginal exam and retesting.  Thanks! Sincerely, Dr. Mardelle Matte

## 2023-08-10 NOTE — Patient Instructions (Addendum)
Please return in 3 months to recheck diabetes and blood pressure    If you have any questions or concerns, please don't hesitate to send me a message via MyChart or call the office at [phone]. Thank you for visiting with Carol [name] today! It's our pleasure caring for you.   VISIT SUMMARY:  During today's visit, we reviewed your diabetes and hypertension management, addressed your concerns about intermittent vaginal bleeding and discharge, and discussed your recent upper respiratory symptoms. Your diabetes and blood pressure are well-controlled, and we have a plan to address your other symptoms.  YOUR PLAN:  -VAGINAL BLEEDING: You have been experiencing intermittent pink tinge on toilet paper after urination, which may be due to atrophic vaginitis, a condition caused by low estrogen levels after menopause. We will treat this with estradiol cream, to be used one applicator full three times weekly as needed. We will also check your urine to rule out any blood.  -TYPE 2 DIABETES MELLITUS: Your diabetes is well-controlled with your current medication, Marcelline Deist, and your recent A1c is 6.5%. Marcelline Deist helps protect your kidneys. Please continue taking Farxiga 10 mg daily and monitor your weight and appetite.  -HYPERTENSION: Your blood pressure is well-controlled with your current medications, with home readings around 117-120/70. It's important to continue monitoring your blood pressure at home to avoid 'white coat hypertension,' which is elevated readings due to anxiety during office visits. Please continue your current medications.  -HYPOKALEMIA: Your potassium levels are slightly low, likely due to your diuretic medication, hydrochlorothiazide. We will monitor your potassium levels and may consider switching your diuretic if the low potassium persists.  -UPPER RESPIRATORY INFECTION: You have symptoms of a likely viral upper respiratory infection, including nasal congestion and mild headache. These infections  usually resolve on their own. Please monitor your symptoms and report if they worsen or persist beyond one week.  INSTRUCTIONS:  Please follow up in three months to recheck your potassium levels and diabetes management.

## 2023-08-10 NOTE — Progress Notes (Signed)
Subjective  CC:  Chief Complaint  Patient presents with   Hematuria    Pt stated that she has been noticing some blood in her urine and when she wipes and this has been going on for the past month and not sure if it is related to menopause. Experience some sinus problems    HPI: Carol [name] is a 58 y.o. female who presents to the office today for follow up of diabetes and problems listed above in the chief complaint.  Discussed the use of AI scribe software for clinical note transcription with the patient, who gave verbal consent to proceed.  History of Present Illness   The patient, with a history of diabetes and hypertension, presents for a routine follow-up. She reports good tolerance of Farxiga, with a recent A1c of 6.5. She has not noticed any significant changes in appetite or weight since starting the medication. Her blood pressure at home has been stable, with readings around 117-120/70. She has been taking hydrochlorothiazide, which has been associated with slightly low potassium levels. I reviewed recent cardiology f/u and recs a sell.   The patient also reports intermittent vaginal bleeding, described as a pink tinge on toilet paper after urination. She denies seeing blood in the urine or in the toilet bowl. She has a history of hysterectomy and is unsure of the source of the bleeding. She also reports a milky white vaginal discharge and mild itching, but denies any odor. She has a history of bacterial vaginosis. She was evaluated for this by gyn; I reviewed notes and test results. Negative ua, urine cxr, neg BV,yeast/trich. She has had sxs of vaginal dryness in the past before and is postmenopausal. No urinary sxs are present.  In addition, the patient reports symptoms of an upper respiratory infection, including a runny nose and aching in the forehead and chest. These symptoms started a few days prior to the visit. No f/c/s or sig cough/sob.     Wt Readings from Last 3  Encounters:  08/10/23 186 lb 3.2 oz (84.5 kg)  07/13/23 189 lb 11.2 oz (86 kg)  07/03/23 188 lb 6.4 oz (85.5 kg)    BP Readings from Last 3 Encounters:  08/10/23 112/70  07/13/23 132/78  07/03/23 (!) 143/72    Assessment  1. Type 2 diabetes mellitus with diabetic nephropathy, without long-term current use of insulin (HCC)   2. White coat syndrome with diagnosis of hypertension   3. Stage 3a chronic kidney disease (HCC)   4. Diuretic-induced hypokalemia   5. Gross hematuria   6. Menopausal vaginal dryness   7. Vaginitis, atrophic   8. Viral URI      Plan  Assessment and Plan    atrophic vaginitis Intermittent pink tinge on toilet paper after urination. No hematuria or bowel blood. History of hysterectomy. Possible atrophic vaginitis due to menopausal status and estrogen deficiency. Previous negative cervicovaginal and urine tests by Dr. Hyacinth Meeker. - Treat with estradiol cream, one applicator full three times weekly as needed - Check urine microscopy to rule out hematuria  Type 2 Diabetes Mellitus Well-controlled with Marcelline Deist. Hemoglobin A1c is 6.5%. No significant side effects. Uncertain weight changes. Marcelline Deist expected to protect renal function. Importance of healthy diet discussed. - Continue Farxiga 10 mg daily - Monitor weight and appetite  Hypertension Well-controlled with current medications. Home readings consistently 117-120/70s. Anxiety during office visits may elevate readings. Importance of home monitoring to avoid white coat hypertension discussed. - Continue current antihypertensive medications - Monitor  blood pressure at home  Hypokalemia Slightly low potassium, likely due to hydrochlorothiazide. Potential need to switch diuretics if potassium remains low discussed. - Monitor potassium levels - Consider switching diuretics if hypokalemia persists (change to spironolactone if needed)  Upper Respiratory Infection Nasal congestion, clear nasal discharge, mild  headache since Monday. Likely viral etiology. Typical course and signs of bacterial infection discussed. - Monitor symptoms, supportive care with coricidin bp or mucinex dm and/or allegra. - Report if symptoms worsen or persist beyond one week  Follow-up - Follow up in three months to recheck potassium and diabetes.     Orders Placed This Encounter  Procedures   Urinalysis, Routine w reflex microscopic   POCT glycosylated hemoglobin (Hb A1C)   Meds ordered this encounter  Medications   estradiol (ESTRACE) 0.1 MG/GM vaginal cream    Sig: Place 1 Applicatorful vaginally 3 (three) times a week.    Dispense:  42.5 g    Refill:  0      Immunization History  Administered Date(s) Administered   Influenza Whole 05/31/2018   Influenza,inj,Quad PF,6+ Mos 07/10/2015, 06/13/2017, 06/13/2018, 06/04/2019, 04/29/2020, 05/28/2021, 06/17/2022   Influenza-Unspecified 06/13/2017   PFIZER(Purple Top)SARS-COV-2 Vaccination 12/14/2019, 01/04/2020, 08/28/2020   PNEUMOCOCCAL CONJUGATE-20 07/18/2022   Tdap 06/24/2015   Zoster Recombinant(Shingrix) 02/13/2023, 05/19/2023    Diabetes Related Lab Review: Lab Results  Component Value Date   HGBA1C 6.5 (A) 08/10/2023   HGBA1C 7.0 (A) 05/19/2023   HGBA1C 6.9 (H) 02/13/2023    Lab Results  Component Value Date   MICROALBUR <0.7 02/13/2023   Lab Results  Component Value Date   CREATININE 1.12 05/19/2023   BUN 19 05/19/2023   NA 139 05/19/2023   K 3.4 (L) 05/19/2023   CL 101 05/19/2023   CO2 28 05/19/2023   Lab Results  Component Value Date   CHOL 130 05/19/2023   CHOL 155 02/13/2023   CHOL 138 12/29/2021   Lab Results  Component Value Date   HDL 39.20 05/19/2023   HDL 32.50 (L) 02/13/2023   HDL 39.70 12/29/2021   Lab Results  Component Value Date   LDLCALC 71 05/19/2023   LDLCALC 94 02/13/2023   LDLCALC 75 12/29/2021   Lab Results  Component Value Date   TRIG 102.0 05/19/2023   TRIG 142.0 02/13/2023   TRIG 115.0 12/29/2021    Lab Results  Component Value Date   CHOLHDL 3 05/19/2023   CHOLHDL 5 02/13/2023   CHOLHDL 3 12/29/2021   Lab Results  Component Value Date   LDLDIRECT 144.0 04/02/2019   The 10-year ASCVD risk score (Arnett DK, et al., 2019) is: 7.2%   Values used to calculate the score:     Age: 79 years     Sex: Female     Is Non-Hispanic African American: Yes     Diabetic: Yes     Tobacco smoker: No     Systolic Blood Pressure: 112 mmHg     Is BP treated: Yes     HDL Cholesterol: 39.2 mg/dL     Total Cholesterol: 130 mg/dL I have reviewed the PMH, Fam and Soc history. Patient Active Problem List   Diagnosis Date Noted Date Diagnosed   Type 2 diabetes mellitus with diabetic nephropathy, without long-term current use of insulin (HCC) 02/13/2023     Priority: High    Diagnosis 2024; farxiga 10    Combined hyperlipidemia associated with type 2 diabetes mellitus (HCC) 05/17/2021     Priority: High   Obesity (BMI 30-39.9) 10/23/2019  Priority: High   White coat syndrome with diagnosis of hypertension 07/18/2018     Priority: High    Dr. Duke Salvia, cards; consider changing hydrochlorothiazide to spironolactone due to hypokalemia.  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 No hemoglobin variant is identified. Alpha thalassemia is possible if iron deficiency and other causes of low MCV or MCH are ruled out.     Sinus tachycardia 12/13/2019     Priority: Medium    Diuretic-induced hypokalemia 07/18/2018     Priority: Medium     Hydrochlorothiazide, takes 10 mEq of potassium supplement daily.    Seasonal allergic rhinitis due to pollen 12/10/2020     Priority: Low   Menopausal  vaginal dryness 07/18/2018     Priority: Low   H/O: hysterectomy 06/17/2022    Snoring 09/02/2021     Social History: Patient  reports that she has never smoked. She has never used smokeless tobacco. She reports current alcohol use. She reports that she does not use drugs.  Review of Systems: Ophthalmic: negative for eye pain, loss of vision or double vision Cardiovascular: negative for chest pain Respiratory: negative for SOB or persistent cough Gastrointestinal: negative for abdominal pain Genitourinary: negative for dysuria or gross hematuria MSK: negative for foot lesions Neurologic: negative for weakness or gait disturbance  Objective  Vitals: BP 112/70 Comment: home readings  Pulse 74   Temp 98.3 F (36.8 C)   Ht 5\' 2"  (1.575 m)   Wt 186 lb 3.2 oz (84.5 kg)   LMP 11/27/2005   SpO2 98%   BMI 34.06 kg/m  General: well appearing, no acute distress  Psych:  Alert and oriented, normal mood and affect HEENT:  Normocephalic, atraumatic, moist mucous membranes, supple neck , nasal congestion present Cardiovascular:  Nl S1 and S2, RRR without murmur, gallop or rub. no edema Respiratory:  Good breath sounds bilaterally, CTAB with normal effort, no rales  Diabetic education: ongoing education regarding chronic disease management for diabetes was given today. We continue to reinforce the ABC's of diabetic management: A1c (<7 or 8 dependent upon patient), tight blood pressure control, and cholesterol management with goal LDL < 100 minimally. We discuss diet strategies, exercise recommendations, medication options and possible side effects. At each visit, we review recommended immunizations and preventive care recommendations for diabetics and stress that good diabetic control can prevent other problems. See below for this patient's data.   Commons side effects, risks, benefits, and alternatives for medications and treatment plan prescribed today were discussed, and the patient  expressed understanding of the given instructions. Patient is instructed to call or message via MyChart if he/she has any questions or concerns regarding our treatment plan. No barriers to understanding were identified. We discussed Red Flag symptoms and signs in detail. Patient expressed understanding regarding what to do in case of urgent or emergency type symptoms.  Medication list was reconciled, printed and provided to the patient in AVS. Patient instructions and summary information was reviewed with the patient as documented in the AVS. This note was prepared with assistance of Dragon voice recognition software. Occasional wrong-word or sound-a-like substitutions may have occurred due to the inherent limitations of voice recognition software

## 2023-08-16 ENCOUNTER — Ambulatory Visit: Payer: Commercial Managed Care - PPO | Admitting: Family Medicine

## 2023-09-03 ENCOUNTER — Other Ambulatory Visit: Payer: Self-pay | Admitting: Family Medicine

## 2023-09-03 DIAGNOSIS — I10 Essential (primary) hypertension: Secondary | ICD-10-CM

## 2023-09-25 ENCOUNTER — Ambulatory Visit (HOSPITAL_BASED_OUTPATIENT_CLINIC_OR_DEPARTMENT_OTHER): Payer: Commercial Managed Care - PPO | Admitting: Obstetrics & Gynecology

## 2023-10-09 ENCOUNTER — Other Ambulatory Visit: Payer: Self-pay | Admitting: Family Medicine

## 2023-10-09 DIAGNOSIS — E1169 Type 2 diabetes mellitus with other specified complication: Secondary | ICD-10-CM

## 2023-10-24 ENCOUNTER — Encounter (HOSPITAL_BASED_OUTPATIENT_CLINIC_OR_DEPARTMENT_OTHER): Payer: Self-pay | Admitting: Obstetrics & Gynecology

## 2023-10-24 ENCOUNTER — Ambulatory Visit (HOSPITAL_BASED_OUTPATIENT_CLINIC_OR_DEPARTMENT_OTHER): Payer: Commercial Managed Care - PPO | Admitting: Obstetrics & Gynecology

## 2023-10-24 VITALS — BP 139/84 | HR 86 | Ht 62.0 in | Wt 181.0 lb

## 2023-10-24 DIAGNOSIS — Z23 Encounter for immunization: Secondary | ICD-10-CM

## 2023-10-24 DIAGNOSIS — Z01419 Encounter for gynecological examination (general) (routine) without abnormal findings: Secondary | ICD-10-CM | POA: Diagnosis not present

## 2023-10-24 DIAGNOSIS — N952 Postmenopausal atrophic vaginitis: Secondary | ICD-10-CM | POA: Diagnosis not present

## 2023-10-24 DIAGNOSIS — N950 Postmenopausal bleeding: Secondary | ICD-10-CM

## 2023-10-24 DIAGNOSIS — Z90710 Acquired absence of both cervix and uterus: Secondary | ICD-10-CM

## 2023-10-24 MED ORDER — ESTRADIOL 0.1 MG/GM VA CREA
TOPICAL_CREAM | VAGINAL | 2 refills | Status: AC
Start: 2023-10-25 — End: ?

## 2023-10-24 NOTE — Progress Notes (Signed)
 ANNUAL EXAM Patient name: Carol [name] MRN [id number]  Date of birth: 08-10-1966 Chief Complaint:   Gynecologic Exam  History of Present Illness:   Carol [name] is a 58 y.o. G40P2002 African-American female being seen today for a routine annual exam.  She has had some vaginal spotting.  This was in November.  She was started on vaginal estrogen cream (from Dr. Mardelle Matte) sometime in December.  She started using this week three times weekly.  Spotting has significantly improved and she's not had any spotting since late December (right after starting).      Patient's last menstrual period was 11/27/2005.  Last pap prior to hysterectomy in 2007 Last mammogram: 05/19/23. Results were: normal.  Last colonoscopy: 09/19/15. Results were: normal. F/U 10 years.  Family h/o colorectal cancer: no     10/24/2023    2:41 PM 08/10/2023    8:12 AM 02/13/2023    3:41 PM 07/18/2022    3:31 PM 06/17/2022    8:25 AM  Depression screen PHQ 2/9  Decreased Interest 0 0 0 0 0  Down, Depressed, Hopeless 0 0 0 0 0  PHQ - 2 Score 0 0 0 0 0        08/10/2023    8:13 AM 08/10/2023    8:12 AM 02/13/2023    3:41 PM 11/06/2019    4:25 PM  GAD 7 : Generalized Anxiety Score  Nervous, Anxious, on Edge 0 0 0 1  Control/stop worrying 0 0 0 0  Worry too much - different things 0 0 0 2  Trouble relaxing 0 0 0 0  Restless 0 0 0 0  Easily annoyed or irritable 0 0 0 0  Afraid - awful might happen 0 0 0 1  Total GAD 7 Score 0 0 0 4  Anxiety Difficulty Not difficult at all Not difficult at all Not difficult at all Not difficult at all     Review of Systems:   Pertinent items are noted in HPI  Denies any headaches, blurred vision, fatigue, shortness of breath, chest pain, abdominal pain, abnormal vaginal discharge/itching/odor/irritation, problems with periods, bowel movements, urination, or intercourse unless otherwise stated above. Pertinent History Reviewed:  Reviewed past medical,surgical, social and family  history.   Reviewed problem list, medications and allergies. Physical Assessment:   Vitals:   10/24/23 1437  BP: 139/84  Pulse: 86  Weight: 181 lb (82.1 kg)  Height: 5\' 2"  (1.575 m)  Body mass index is 33.11 kg/m.        Physical Examination:   General appearance - well appearing, and in no distress  Mental status - alert, oriented to person, place, and time  Psych:  She has a normal mood and affect  Skin - warm and dry, normal color, no suspicious lesions noted  Chest - effort normal, all lung fields clear to auscultation bilaterally  Heart - normal rate and regular rhythm  Neck:  midline trachea, no thyromegaly or nodules  Breasts - breasts appear normal, no suspicious masses, no skin or nipple changes or  axillary nodes  Abdomen - soft, nontender, nondistended, no masses or organomegaly  Pelvic - VULVA: normal appearing vulva with no masses, tenderness or lesions   VAGINA: normal appearing vagina with normal color and discharge, no lesions   CERVIX: surgically absent  Thin prep pap   UTERUS: surgically absent  ADNEXA: No adnexal masses or tenderness noted.  Rectal - normal rectal, good sphincter tone, no masses felt.  Extremities:  No swelling or varicosities noted  Chaperone present for exam, Raechel Ache, RN.  No results found for this or any previous visit (from the past 24 hours).  Assessment & Plan:  1. Well woman exam with routine gynecological exam - Pap smear not indicated - Mammogram 04/2023 - Colonoscopy 2019, follow up 10 yeras - Bone mineral density not indicated - lab work done with PCP, Dr. Mardelle Matte - vaccines reviewed/updated.  Flu shot given.  2. Postmenopausal bleeding - has resolved with using vaginal estrogen cream.  Pt advised to let me know if has any more bleeding.    3. Vaginal atrophy - will continue estradiol cream.  Dosing decreased to 1 gram pv once or twice wekely - estradiol (ESTRACE) 0.1 MG/GM vaginal cream; 1 gram vaginally 1 - 2 times weekly   Dispense: 42.5 g; Refill: 2  4. H/O: hysterectomy  5. Flu vaccine need (Primary) - Flu vaccine trivalent PF, 6mos and older(Flulaval,Afluria,Fluarix,Fluzone)    Orders Placed This Encounter  Procedures   Flu vaccine trivalent PF, 6mos and older(Flulaval,Afluria,Fluarix,Fluzone)    Meds: No orders of the defined types were placed in this encounter.   Follow-up: No follow-ups on file.  Harrie Jeans, RN 10/24/2023 2:47 PM

## 2023-11-13 ENCOUNTER — Encounter: Payer: Self-pay | Admitting: Family Medicine

## 2023-11-13 ENCOUNTER — Ambulatory Visit (INDEPENDENT_AMBULATORY_CARE_PROVIDER_SITE_OTHER): Payer: Commercial Managed Care - PPO | Admitting: Family Medicine

## 2023-11-13 VITALS — BP 126/72 | HR 89 | Temp 98.1°F | Ht 62.0 in | Wt 182.4 lb

## 2023-11-13 DIAGNOSIS — E1121 Type 2 diabetes mellitus with diabetic nephropathy: Secondary | ICD-10-CM | POA: Diagnosis not present

## 2023-11-13 DIAGNOSIS — E876 Hypokalemia: Secondary | ICD-10-CM

## 2023-11-13 DIAGNOSIS — T502X5A Adverse effect of carbonic-anhydrase inhibitors, benzothiadiazides and other diuretics, initial encounter: Secondary | ICD-10-CM

## 2023-11-13 DIAGNOSIS — Z7984 Long term (current) use of oral hypoglycemic drugs: Secondary | ICD-10-CM | POA: Diagnosis not present

## 2023-11-13 DIAGNOSIS — E119 Type 2 diabetes mellitus without complications: Secondary | ICD-10-CM

## 2023-11-13 DIAGNOSIS — I10 Essential (primary) hypertension: Secondary | ICD-10-CM

## 2023-11-13 DIAGNOSIS — J301 Allergic rhinitis due to pollen: Secondary | ICD-10-CM | POA: Diagnosis not present

## 2023-11-13 LAB — POCT GLYCOSYLATED HEMOGLOBIN (HGB A1C): Hemoglobin A1C: 6 % — AB (ref 4.0–5.6)

## 2023-11-13 MED ORDER — FLUTICASONE PROPIONATE 50 MCG/ACT NA SUSP: 1.0000 | Freq: Every day | NASAL | 6 refills | Status: AC

## 2023-11-13 MED ORDER — MONTELUKAST SODIUM 10 MG PO TABS: 10.0000 mg | ORAL_TABLET | Freq: Every day | ORAL | 3 refills | Status: AC

## 2023-11-13 NOTE — Progress Notes (Signed)
 Subjective  CC:  Chief Complaint  Patient presents with   Hyperlipidemia   Hypertension    Pt here to f.u on BP    Allergies    HPI: Carol [name] is a 58 y.o. female who presents to the office today for follow up of diabetes and problems listed above in the chief complaint.  Diabetes follow up: Her diabetic control is reported as Unchanged. On farxiga. No concerns.  She denies exertional CP or SOB or symptomatic hypoglycemia. She denies foot sores or paresthesias.  HTN with white coat response. Stable. Sees cards. Reviewed notes. Low K due for recheck on hydrochlorothiazide. Does not tolerate spironolactone. Taking kcl 10 daily Allergies: active with eye and nasal sxs. On allegra.   Wt Readings from Last 3 Encounters:  11/13/23 182 lb 6.4 oz (82.7 kg)  10/24/23 181 lb (82.1 kg)  08/10/23 186 lb 3.2 oz (84.5 kg)    BP Readings from Last 3 Encounters:  11/13/23 126/72  10/24/23 139/84  08/10/23 112/70    Assessment  1. Type 2 diabetes mellitus with diabetic nephropathy, without long-term current use of insulin (HCC)   2. Diabetes mellitus treated with oral medication (HCC)   3. White coat syndrome with diagnosis of hypertension   4. Diuretic-induced hypokalemia   5. Seasonal allergic rhinitis due to pollen      Plan  Diabetes is currently very well controlled. Continue farxiga. Recheck urine nephropathy screen.  HTN is controlle.d recheck potassium levels.  Add flonase and singulair for allergies to allegra.   Follow up: 6 mo for cpe Orders Placed This Encounter  Procedures   Basic metabolic panel   Microalbumin / creatinine urine ratio   POCT HgB A1C   Meds ordered this encounter  Medications   fluticasone (FLONASE) 50 MCG/ACT nasal spray    Sig: Place 1 spray into both nostrils daily.    Dispense:  16 g    Refill:  6   montelukast (SINGULAIR) 10 MG tablet    Sig: Take 1 tablet (10 mg total) by mouth at bedtime.    Dispense:  90 tablet    Refill:  3       Immunization History  Administered Date(s) Administered   Influenza Whole 05/31/2018   Influenza, Seasonal, Injecte, Preservative Fre 10/24/2023   Influenza,inj,Quad PF,6+ Mos 07/10/2015, 06/13/2017, 06/13/2018, 06/04/2019, 04/29/2020, 05/28/2021, 06/17/2022   Influenza-Unspecified 06/13/2017   PFIZER(Purple Top)SARS-COV-2 Vaccination 12/14/2019, 01/04/2020, 08/28/2020   PNEUMOCOCCAL CONJUGATE-20 07/18/2022   Tdap 06/24/2015   Zoster Recombinant(Shingrix) 02/13/2023, 05/19/2023    Diabetes Related Lab Review: Lab Results  Component Value Date   HGBA1C 6.0 (A) 11/13/2023   HGBA1C 6.5 (A) 08/10/2023   HGBA1C 7.0 (A) 05/19/2023    Lab Results  Component Value Date   MICROALBUR <0.7 02/13/2023   Lab Results  Component Value Date   CREATININE 1.12 05/19/2023   BUN 19 05/19/2023   NA 139 05/19/2023   K 3.4 (L) 05/19/2023   CL 101 05/19/2023   CO2 28 05/19/2023   Lab Results  Component Value Date   CHOL 130 05/19/2023   CHOL 155 02/13/2023   CHOL 138 12/29/2021   Lab Results  Component Value Date   HDL 39.20 05/19/2023   HDL 32.50 (L) 02/13/2023   HDL 39.70 12/29/2021   Lab Results  Component Value Date   LDLCALC 71 05/19/2023   LDLCALC 94 02/13/2023   LDLCALC 75 12/29/2021   Lab Results  Component Value Date   TRIG 102.0 05/19/2023  TRIG 142.0 02/13/2023   TRIG 115.0 12/29/2021   Lab Results  Component Value Date   CHOLHDL 3 05/19/2023   CHOLHDL 5 02/13/2023   CHOLHDL 3 12/29/2021   Lab Results  Component Value Date   LDLDIRECT 144.0 04/02/2019   The 10-year ASCVD risk score (Arnett DK, et al., 2019) is: 10.6%   Values used to calculate the score:     Age: 63 years     Sex: Female     Is Non-Hispanic African American: Yes     Diabetic: Yes     Tobacco smoker: No     Systolic Blood Pressure: 126 mmHg     Is BP treated: Yes     HDL Cholesterol: 39.2 mg/dL     Total Cholesterol: 130 mg/dL I have reviewed the PMH, Fam and Soc history. Patient  Active Problem List   Diagnosis Date Noted Date Diagnosed   Type 2 diabetes mellitus with diabetic nephropathy, without long-term current use of insulin (HCC) 02/13/2023     Priority: High    Diagnosis 2024; farxiga 10    Combined hyperlipidemia associated with type 2 diabetes mellitus (HCC) 05/17/2021     Priority: High   Obesity (BMI 30-39.9) 10/23/2019     Priority: High   White coat syndrome with diagnosis of hypertension 07/18/2018     Priority: High    Dr. Duke Salvia, cards; consider changing hydrochlorothiazide to spironolactone due to hypokalemia.  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 No hemoglobin variant is identified. Alpha thalassemia is possible if iron deficiency and other causes of low MCV or MCH are ruled out.     Sinus tachycardia 12/13/2019     Priority: Medium    Diuretic-induced hypokalemia 07/18/2018     Priority: Medium     Hydrochlorothiazide, takes 10 mEq of potassium supplement daily.    Seasonal allergic rhinitis due to pollen 12/10/2020     Priority: Low   Menopausal vaginal dryness 07/18/2018     Priority: Low   H/O: hysterectomy 06/17/2022    Snoring 09/02/2021     Social History: Patient  reports that she has never smoked. She has never used smokeless tobacco. She reports current alcohol use. She reports that she does not use drugs.  Review of Systems: Ophthalmic: negative for eye pain, loss of vision or double vision Cardiovascular: negative for chest pain Respiratory: negative for SOB or persistent cough Gastrointestinal: negative for abdominal pain Genitourinary: negative for dysuria or gross hematuria MSK: negative for foot  lesions Neurologic: negative for weakness or gait disturbance  Objective  Vitals: BP 126/72 Comment: home readings  Pulse 89   Temp 98.1 F (36.7 C)   Ht 5\' 2"  (1.575 m)   Wt 182 lb 6.4 oz (82.7 kg)   LMP 11/27/2005   SpO2 97%   BMI 33.36 kg/m  General: well appearing, no acute distress  Psych:  Alert and oriented, normal mood and affect HEENT:  Normocephalic, atraumatic, moist mucous membranes, supple neck  Cardiovascular:  Nl S1 and S2, RRR without murmur, gallop or rub. no edema Respiratory:  Good breath sounds bilaterally, CTAB with normal effort, no rales Ext no edema Diabetic education: ongoing education regarding  chronic disease management for diabetes was given today. We continue to reinforce the ABC's of diabetic management: A1c (<7 or 8 dependent upon patient), tight blood pressure control, and cholesterol management with goal LDL < 100 minimally. We discuss diet strategies, exercise recommendations, medication options and possible side effects. At each visit, we review recommended immunizations and preventive care recommendations for diabetics and stress that good diabetic control can prevent other problems. See below for this patient's data.   Commons side effects, risks, benefits, and alternatives for medications and treatment plan prescribed today were discussed, and the patient expressed understanding of the given instructions. Patient is instructed to call or message via MyChart if he/she has any questions or concerns regarding our treatment plan. No barriers to understanding were identified. We discussed Red Flag symptoms and signs in detail. Patient expressed understanding regarding what to do in case of urgent or emergency type symptoms.  Medication list was reconciled, printed and provided to the patient in AVS. Patient instructions and summary information was reviewed with the patient as documented in the AVS. This note was prepared with assistance of Dragon voice  recognition software. Occasional wrong-word or sound-a-like substitutions may have occurred due to the inherent limitations of voice recognition software

## 2023-11-13 NOTE — Patient Instructions (Signed)
Please return in 6 months for your annual complete physical; please come fasting.   I will release your lab results to you on your MyChart account with further instructions. You may see the results before I do, but when I review them I will send you a message with my report or have my assistant call you if things need to be discussed. Please reply to my message with any questions. Thank you!   If you have any questions or concerns, please don't hesitate to send me a message via MyChart or call the office at [phone]. Thank you for visiting with us today! It's our pleasure caring for you.  

## 2023-11-14 ENCOUNTER — Encounter: Payer: Self-pay | Admitting: Family Medicine

## 2023-11-14 LAB — MICROALBUMIN / CREATININE URINE RATIO
Creatinine,U: 110.8 mg/dL
Microalb Creat Ratio: UNDETERMINED mg/g (ref 0.0–30.0)
Microalb, Ur: <0.7 mg/dL

## 2023-11-14 LAB — BASIC METABOLIC PANEL
BUN: 15 mg/dL (ref 6–23)
CO2: 30 meq/L (ref 19–32)
Calcium: 9.7 mg/dL (ref 8.4–10.5)
Chloride: 100 meq/L (ref 96–112)
Creatinine, Ser: 1.2 mg/dL (ref 0.40–1.20)
GFR: 50.45 mL/min — ABNORMAL LOW (ref >60.00)
Glucose, Bld: 114 mg/dL — ABNORMAL HIGH (ref 70–99)
Potassium: 3.4 meq/L — ABNORMAL LOW (ref 3.5–5.1)
Sodium: 137 meq/L (ref 135–145)

## 2023-11-14 MED ORDER — POTASSIUM CHLORIDE CRYS ER 20 MEQ PO TBCR: 20.0000 meq | EXTENDED_RELEASE_TABLET | Freq: Every day | ORAL | 3 refills | Status: AC

## 2023-11-14 NOTE — Progress Notes (Signed)
 See mychart note Dear Ms. Feagans, These increase your potassium supplements to 20 mEq daily.  I have ordered you a larger pill.  This should keep your potassium at a better level.  Great seeing you.  Your urine test and other lab results are stable. Sincerely, Dr. Mardelle Matte

## 2023-11-14 NOTE — Addendum Note (Signed)
 Addended by: Asencion Partridge on: 11/14/2023 01:31 PM   Modules accepted: Orders

## 2024-03-25 ENCOUNTER — Other Ambulatory Visit: Payer: Self-pay | Admitting: Obstetrics & Gynecology

## 2024-03-25 DIAGNOSIS — Z1231 Encounter for screening mammogram for malignant neoplasm of breast: Secondary | ICD-10-CM

## 2024-04-17 LAB — HM DIABETES EYE EXAM

## 2024-05-17 ENCOUNTER — Encounter: Admitting: Family Medicine

## 2024-05-20 ENCOUNTER — Ambulatory Visit
Admission: RE | Admit: 2024-05-20 | Discharge: 2024-05-20 | Disposition: A | Discharge status: Home / Self Care | Source: Ambulatory Visit | Attending: Obstetrics & Gynecology | Admitting: Obstetrics & Gynecology

## 2024-05-20 ENCOUNTER — Ambulatory Visit

## 2024-05-20 ENCOUNTER — Ambulatory Visit: Admitting: Family Medicine

## 2024-05-20 DIAGNOSIS — Z1231 Encounter for screening mammogram for malignant neoplasm of breast: Secondary | ICD-10-CM

## 2024-05-23 ENCOUNTER — Other Ambulatory Visit (HOSPITAL_COMMUNITY): Payer: Self-pay

## 2024-05-23 ENCOUNTER — Encounter: Payer: Self-pay | Admitting: Family Medicine

## 2024-05-23 ENCOUNTER — Ambulatory Visit: Admitting: Family Medicine

## 2024-05-23 ENCOUNTER — Telehealth: Payer: Self-pay

## 2024-05-23 VITALS — BP 110/72 | HR 74 | Temp 98.1°F | Ht 62.0 in | Wt 183.6 lb

## 2024-05-23 DIAGNOSIS — E782 Mixed hyperlipidemia: Secondary | ICD-10-CM

## 2024-05-23 DIAGNOSIS — E669 Obesity, unspecified: Secondary | ICD-10-CM

## 2024-05-23 DIAGNOSIS — I10 Essential (primary) hypertension: Secondary | ICD-10-CM

## 2024-05-23 DIAGNOSIS — N1831 Chronic kidney disease, stage 3a: Secondary | ICD-10-CM | POA: Diagnosis not present

## 2024-05-23 DIAGNOSIS — Z7984 Long term (current) use of oral hypoglycemic drugs: Secondary | ICD-10-CM

## 2024-05-23 DIAGNOSIS — E876 Hypokalemia: Secondary | ICD-10-CM

## 2024-05-23 DIAGNOSIS — H1045 Other chronic allergic conjunctivitis: Secondary | ICD-10-CM

## 2024-05-23 DIAGNOSIS — E1121 Type 2 diabetes mellitus with diabetic nephropathy: Secondary | ICD-10-CM | POA: Diagnosis not present

## 2024-05-23 DIAGNOSIS — E1169 Type 2 diabetes mellitus with other specified complication: Secondary | ICD-10-CM | POA: Diagnosis not present

## 2024-05-23 DIAGNOSIS — Z6833 Body mass index (BMI) 33.0-33.9, adult: Secondary | ICD-10-CM | POA: Diagnosis not present

## 2024-05-23 DIAGNOSIS — Z23 Encounter for immunization: Secondary | ICD-10-CM | POA: Diagnosis not present

## 2024-05-23 DIAGNOSIS — Z0001 Encounter for general adult medical examination with abnormal findings: Secondary | ICD-10-CM

## 2024-05-23 DIAGNOSIS — J309 Allergic rhinitis, unspecified: Secondary | ICD-10-CM

## 2024-05-23 LAB — CBC WITH DIFFERENTIAL/PLATELET
Basophils Absolute: 0 K/uL (ref 0.0–0.1)
Basophils Relative: 0.2 % (ref 0.0–3.0)
Eosinophils Absolute: 0.2 K/uL (ref 0.0–0.7)
Eosinophils Relative: 2.6 % (ref 0.0–5.0)
HCT: 37.7 % (ref 36.0–46.0)
Hemoglobin: 12 g/dL (ref 12.0–15.0)
Lymphocytes Relative: 29.1 % (ref 12.0–46.0)
Lymphs Abs: 2 K/uL (ref 0.7–4.0)
MCHC: 31.7 g/dL (ref 30.0–36.0)
MCV: 73.3 fl — ABNORMAL LOW (ref 78.0–100.0)
Monocytes Absolute: 0.5 K/uL (ref 0.1–1.0)
Monocytes Relative: 6.8 % (ref 3.0–12.0)
Neutro Abs: 4.1 K/uL (ref 1.4–7.7)
Neutrophils Relative %: 61.3 % (ref 43.0–77.0)
Platelets: 319 K/uL (ref 150.0–400.0)
RBC: 5.14 Mil/uL — ABNORMAL HIGH (ref 3.87–5.11)
RDW: 15.9 % — ABNORMAL HIGH (ref 11.5–15.5)
WBC: 6.8 K/uL (ref 4.0–10.5)

## 2024-05-23 LAB — COMPREHENSIVE METABOLIC PANEL WITH GFR
ALT: 21 U/L (ref 0–35)
AST: 17 U/L (ref 0–37)
Albumin: 3.9 g/dL (ref 3.5–5.2)
Alkaline Phosphatase: 62 U/L (ref 39–117)
BUN: 15 mg/dL (ref 6–23)
CO2: 29 meq/L (ref 19–32)
Calcium: 9.7 mg/dL (ref 8.4–10.5)
Chloride: 101 meq/L (ref 96–112)
Creatinine, Ser: 1.11 mg/dL (ref 0.40–1.20)
GFR: 55.2 mL/min — ABNORMAL LOW (ref >60.00)
Glucose, Bld: 119 mg/dL — ABNORMAL HIGH (ref 70–99)
Potassium: 3.4 meq/L — ABNORMAL LOW (ref 3.5–5.1)
Sodium: 139 meq/L (ref 135–145)
Total Bilirubin: 0.4 mg/dL (ref 0.2–1.2)
Total Protein: 7.2 g/dL (ref 6.0–8.3)

## 2024-05-23 LAB — LIPID PANEL
Cholesterol: 130 mg/dL (ref 0–200)
HDL: 35 mg/dL — ABNORMAL LOW (ref >39.00)
LDL Cholesterol: 70 mg/dL (ref 0–99)
NonHDL: 95.46
Total CHOL/HDL Ratio: 4
Triglycerides: 125 mg/dL (ref 0.0–149.0)
VLDL: 25 mg/dL (ref 0.0–40.0)

## 2024-05-23 LAB — MICROALBUMIN / CREATININE URINE RATIO
Creatinine,U: 211.9 mg/dL
Microalb Creat Ratio: 4.4 mg/g (ref 0.0–30.0)
Microalb, Ur: 0.9 mg/dL (ref 0.0–1.9)

## 2024-05-23 LAB — TSH: TSH: 2.9 u[IU]/mL (ref 0.35–5.50)

## 2024-05-23 LAB — HEMOGLOBIN A1C: Hgb A1c MFr Bld: 7.6 % — ABNORMAL HIGH (ref 4.6–6.5)

## 2024-05-23 MED ORDER — DAPAGLIFLOZIN PROPANEDIOL 5 MG PO TABS: 5.0000 mg | ORAL_TABLET | Freq: Every day | ORAL | 3 refills | Status: AC

## 2024-05-23 MED ORDER — TIRZEPATIDE 2.5 MG/0.5ML ~~LOC~~ SOAJ: 2.5000 mg | SUBCUTANEOUS | 2 refills | Status: DC

## 2024-05-23 NOTE — Progress Notes (Signed)
 Subjective  Chief Complaint  Patient presents with   57 Exam    Pt here for 57 Exam and is currently fasting    Diabetes    HPI: Carol [name] is a 58 y.o. female who presents to [facility] [place] today for a Female Wellness Visit. She also has the concerns and/or needs as listed above in the chief complaint. These will be addressed in addition to the Health Maintenance Visit.   Wellness Visit: annual visit with health maintenance review and exam  HM:  Chronic disease f/u and/or acute problem visit: (deemed necessary to be done in addition to the wellness visit): Discussed the use of AI scribe software for clinical note transcription with the patient, who gave verbal consent to proceed.  History of Present Illness Discussed the use of AI scribe software for clinical note transcription with the patient, who gave verbal consent to proceed.  History of Present Illness Carol [name] is a 58 year old female who presents for an annual physical exam.  Glycemic control and dietary habits - Diabetes mellitus with recent increase in cravings for sweets - Suboptimal dietary habits - Does not regularly monitor blood glucose levels at home - No increased thirst, polyuria, or blurred vision - Current weight is 183 pounds; unsure of weight change since last visit - Currently taking Farxiga  for diabetes - Previously used Trulicity  with good effect; open to considering other affordable medications  Blood pressure monitoring and anxiety - Home blood pressure readings stable: recent measurements 109/74 mmHg and 115/76 mmHg - Experiences anxiety and nervousness during office blood pressure checks  Allergic symptoms and asthma - Uses Singulair  inconsistently; uncertain efficacy for allergies or asthma - Persistent itchy eyes despite Singulair  - Uses Zyrtec for allergies but not at night   Assessment  1. Encounter for well adult exam with abnormal findings    2. Need for influenza vaccination   3. Type 2 diabetes mellitus with diabetic nephropathy, without long-term current use of insulin (HCC)   4. Stage 3a chronic kidney disease (HCC)   5. Combined hyperlipidemia associated with type 2 diabetes mellitus (HCC)   6. White coat syndrome with diagnosis of hypertension   7. Diuretic-induced hypokalemia   8. Essential hypertension   9. Obesity (BMI 30-39.9)      Plan  Female Wellness Visit: 58 appropriate Health Maintenance and Prevention measures were discussed with patient. Included topics are cancer screening recommendations, ways to keep healthy (see AVS) including dietary and exercise recommendations, regular eye and dental care, use of seat belts, and avoidance of moderate alcohol use and tobacco use.  BMI: discussed patient's BMI and encouraged positive lifestyle modifications to help get to or maintain a target BMI. HM needs and immunizations were addressed and ordered. See below for orders. See HM and immunization section for updates. Routine labs and screening tests ordered including cmp, cbc and lipids where appropriate. Discussed recommendations regarding Vit D and calcium  supplementation (see AVS)  Chronic disease management visit and/or acute problem visit: Assessment and Plan Assessment & Plan Type 2 diabetes mellitus Type 2 diabetes with dietary lapses, no significant hyperglycemia symptoms. Current medication: Farxiga . - Order blood tests for diabetes control. - Add Mounjaro  at low dose, monitor for nausea, constipation, fatigue. - Continue Farxiga  and metformin. May be able to stop metformin if tolerates mounjaro . - Follow up in 3 months for diabetes control and medication efficacy.  Chronic kidney disease, stage 3a Farxiga  continued for kidney protection. - Order  blood tests for kidney function.  Essential hypertension Hypertension well-controlled with home readings 109/74 to 115/76 mmHg. - Continue current blood  pressure management. - Monitor blood pressure at home.  Obesity w/ comorbidities Overweight at 183 lbs. Discussed weight loss benefits of Mounjaro  or Ozempic. Mounjaro  recommended unless Ozempic preferred. - Add Mounjaro  at low dose for weight management and diabetes.  Allergic rhinitis and conjunctiviis Allergic rhinitis with persistent itchy eyes. Singulair  not effective for eye symptoms. - Continue Singulair  nightly. - Add Zyrtec for ocular symptoms. - Use Patanol eye drops for allergic conjunctivitis.   Follow up: 3 mo for recheck dm and weight  Orders Placed This Encounter  Procedures   Flu vaccine trivalent PF, 6mos and older(Flulaval,Afluria,Fluarix,Fluzone)   CBC with Differential/Platelet   Comprehensive metabolic panel with GFR   Lipid panel   Hemoglobin A1c   TSH   Microalbumin / creatinine urine ratio   Meds ordered this encounter  Medications   tirzepatide  (MOUNJARO ) 2.5 MG/0.5ML Pen    Sig: Inject 2.5 mg into the skin once a week.    Dispense:  2 mL    Refill:  2   dapagliflozin  propanediol (FARXIGA ) 5 MG TABS tablet    Sig: Take 1 tablet (5 mg total) by mouth daily before breakfast.    Dispense:  90 tablet    Refill:  3      Body mass index is 33.58 kg/m. Wt Readings from Last 3 Encounters:  05/23/24 183 lb 9.6 oz (83.3 kg)  11/13/23 182 lb 6.4 oz (82.7 kg)  10/24/23 181 lb (82.1 kg)     Patient Active Problem List   Diagnosis Date Noted   Type 2 diabetes mellitus with diabetic nephropathy, without long-term current use of insulin (HCC) 02/13/2023    Priority: High    Diagnosis 2024; farxiga  10    Combined hyperlipidemia associated with type 2 diabetes mellitus (HCC) 05/17/2021    Priority: High   Obesity (BMI 30-39.9) 10/23/2019    Priority: High   White coat syndrome with diagnosis of hypertension 07/18/2018    Priority: High    Dr. Raford, cards; consider changing hydrochlorothiazide  to spironolactone  due to hypokalemia.  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 No hemoglobin variant is identified. Alpha thalassemia is possible if iron deficiency and other causes of low MCV or MCH are ruled out.     Sinus tachycardia 12/13/2019    Priority: Medium    Diuretic-induced hypokalemia 07/18/2018    Priority: Medium     Hydrochlorothiazide , takes 10 mEq of potassium supplement daily.    Seasonal allergic rhinitis due to pollen 12/10/2020    Priority: Low   Menopausal vaginal dryness 07/18/2018    Priority: Low   H/O: hysterectomy 06/17/2022   Snoring 09/02/2021   Health Maintenance  Topic Date Due   Hepatitis B Vaccines 19-59 Average Risk (1 of 3 - 19+ 3-dose series) Never done   HEMOGLOBIN A1C  05/15/2024   COVID-19 Vaccine (4 - 2025-26 season) 06/08/2024 (Originally 04/29/2024)   Diabetic kidney evaluation - eGFR measurement  11/12/2024   Diabetic kidney evaluation - Urine ACR  11/12/2024   OPHTHALMOLOGY EXAM  04/17/2025   Mammogram  05/20/2025  FOOT EXAM  05/23/2025   DTaP/Tdap/Td (2 - Td or Tdap) 06/23/2025   Colonoscopy  09/19/2027   Pneumococcal Vaccine: 50+ Years  Completed   Influenza Vaccine  Completed   Hepatitis C Screening  Completed   HIV Screening  Completed   Zoster Vaccines- Shingrix   Completed   HPV VACCINES  Aged Out   Meningococcal B Vaccine  Aged Out   Immunization History  Administered Date(s) Administered   Influenza Whole 05/31/2018   Influenza, Seasonal, Injecte, Preservative Fre 10/24/2023, 05/23/2024   Influenza,inj,Quad PF,6+ Mos 07/10/2015, 06/13/2017, 06/13/2018, 06/04/2019, 04/29/2020, 05/28/2021, 06/17/2022   Influenza-Unspecified 06/13/2017   PFIZER(Purple  Top)SARS-COV-2 Vaccination 12/14/2019, 01/04/2020, 08/28/2020   PNEUMOCOCCAL CONJUGATE-20 07/18/2022   Tdap 06/24/2015   Zoster Recombinant(Shingrix ) 02/13/2023, 05/19/2023   We updated and reviewed the patient's past history in detail and it is documented below. Allergies: Patient is allergic to doxazosin, lisinopril, and spironolactone . Past Medical History Patient  has a past medical history of Allergy, Anemia, Arthritis, Chicken pox, Depression, GERD (gastroesophageal reflux disease), Hyperlipidemia, Hypertension, Kidney disease (2011), Obesity (BMI 30-39.9) (10/23/2019), and Snoring (09/02/2021). Past Surgical History Patient  has a past surgical history that includes Cesarean section and Abdominal hysterectomy (2007). Family History: Patient family history includes Colon polyps in her mother; Depression in her paternal grandfather; Heart attack in her maternal grandfather; High Cholesterol in her brother and mother; Hypertension in her father, maternal grandmother, mother, and sister; Kidney disease in her brother, mother, and paternal grandfather; Stroke in her maternal grandmother. Social History:  Patient  reports that she has never smoked. She has never used smokeless tobacco. She reports current alcohol use. She reports that she does not use drugs.  Review of Systems: Constitutional: negative for fever or malaise Ophthalmic: negative for photophobia, double vision or loss of vision Cardiovascular: negative for chest pain, dyspnea on exertion, or new LE swelling Respiratory: negative for SOB or persistent cough Gastrointestinal: negative for abdominal pain, change in bowel habits or melena Genitourinary: negative for dysuria or gross hematuria, no abnormal uterine bleeding or disharge Musculoskeletal: negative for new gait disturbance or muscular weakness Integumentary: negative for new or persistent rashes, no breast lumps Neurological: negative for TIA or stroke  symptoms Psychiatric: negative for SI or delusions Allergic/Immunologic: negative for hives  Patient Care Team    Relationship Specialty Notifications Start End  Jodie Lavern CROME, MD PCP - General Family Medicine  10/23/19   Crisoforo Roselynn Schlein, MD Consulting Physician Nephrology  07/17/18   Cleotilde Ronal RAMAN, MD Consulting Physician Gynecology  07/17/18   Avram Lupita BRAVO, MD Consulting Physician Gastroenterology  10/23/19   Raford Riggs, MD Consulting Physician Cardiology  12/29/21     Objective  Vitals: BP 110/72 Comment: by consistent home readings  Pulse 74   Temp 98.1 F (36.7 C)   Ht 5' 2 (1.575 m)   Wt 183 lb 9.6 oz (83.3 kg)   LMP 11/27/2005   SpO2 99%   BMI 33.58 kg/m  General:  Well developed, well nourished, no acute distress  Psych:  Alert and orientedx3,normal mood and affect HEENT:  Normocephalic, atraumatic, non-icteric sclera,  supple neck without adenopathy, mass or thyromegaly Cardiovascular:  Normal S1, S2, RRR without gallop, rub or murmur Respiratory:  Good breath sounds bilaterally, CTAB with normal respiratory effort Gastrointestinal: normal bowel sounds, soft, non-tender, no noted masses. No HSM MSK: extremities without edema, joints without erythema or swelling Neurologic:    Mental status is normal.  Gross motor and sensory exams are normal.  No tremor Diabetic Foot Exam: Appearance - no lesions, ulcers or significant calluses Skin - no sigificant pallor or erythema Normal sensation Pulses - +2 distally bilaterally   Commons side effects, risks, benefits, and alternatives for medications and treatment plan prescribed today were discussed, and the patient expressed understanding of the given instructions. Patient is instructed to call or message via MyChart if he/she has any questions or concerns regarding our treatment plan. No barriers to understanding were identified. We discussed Red Flag symptoms and signs in detail. Patient expressed understanding  regarding what to do in case of urgent or emergency type symptoms.  Medication list was reconciled, printed and provided to the patient in AVS. Patient instructions and summary information was reviewed with the patient as documented in the AVS. This note was prepared with assistance of Dragon voice recognition software. Occasional wrong-word or sound-a-like substitutions may have occurred due to the inherent limitations of voice recognition software

## 2024-05-23 NOTE — Telephone Encounter (Signed)
 Pharmacy Patient Advocate Encounter   Received notification from CoverMyMeds that prior authorization for Mounjaro  2.5MG /0.5ML auto-injectors is required/requested.   Insurance verification completed.   The patient is insured through [employer]  .   Per test claim: PA required; PA submitted to above mentioned insurance via Latent Key/confirmation #/EOC [id number] Status is pending

## 2024-05-28 NOTE — Telephone Encounter (Signed)
 Pharmacy Patient Advocate Encounter  Received notification from [employer]  that Prior Authorization for Mounjaro  2.5MG /0.5ML auto-injectors   has been APPROVED from 05/27/2024 to 05/27/2025   PA #/Case ID/Reference #: [id number]

## 2024-06-02 ENCOUNTER — Ambulatory Visit: Payer: Self-pay | Admitting: Family Medicine

## 2024-06-02 NOTE — Progress Notes (Signed)
 See mychart note Dear Carol [name], Your lab results how that your diabetic control has worsened. The addition of the mounjaro  and improving your diet again should help. Please schedule a visit with me in 3 months to recheck your diabetes numbers. Your other results are all stable. Please take 2 potassium pills daily for 3 days, then go back down to one daily to help replenish your potassium stores. Thanks! Sincerely, Dr. Jodie

## 2024-08-20 ENCOUNTER — Encounter: Payer: Self-pay | Admitting: Family Medicine

## 2024-08-20 ENCOUNTER — Ambulatory Visit (INDEPENDENT_AMBULATORY_CARE_PROVIDER_SITE_OTHER): Admitting: Family Medicine

## 2024-08-20 ENCOUNTER — Telehealth: Payer: Self-pay

## 2024-08-20 VITALS — BP 110/72 | HR 81 | Temp 97.7°F | Ht 62.0 in | Wt 176.6 lb

## 2024-08-20 DIAGNOSIS — I10 Essential (primary) hypertension: Secondary | ICD-10-CM

## 2024-08-20 DIAGNOSIS — Z7985 Long-term (current) use of injectable non-insulin antidiabetic drugs: Secondary | ICD-10-CM | POA: Diagnosis not present

## 2024-08-20 DIAGNOSIS — E876 Hypokalemia: Secondary | ICD-10-CM

## 2024-08-20 DIAGNOSIS — Z7984 Long term (current) use of oral hypoglycemic drugs: Secondary | ICD-10-CM | POA: Diagnosis not present

## 2024-08-20 DIAGNOSIS — E1121 Type 2 diabetes mellitus with diabetic nephropathy: Secondary | ICD-10-CM

## 2024-08-20 DIAGNOSIS — T502X5A Adverse effect of carbonic-anhydrase inhibitors, benzothiadiazides and other diuretics, initial encounter: Secondary | ICD-10-CM | POA: Diagnosis not present

## 2024-08-20 LAB — MAGNESIUM: Magnesium: 2.1 mg/dL (ref 1.5–2.5)

## 2024-08-20 LAB — BASIC METABOLIC PANEL WITH GFR
BUN: 15 mg/dL (ref 6–23)
CO2: 29 meq/L (ref 19–32)
Calcium: 9.4 mg/dL (ref 8.4–10.5)
Chloride: 102 meq/L (ref 96–112)
Creatinine, Ser: 1.09 mg/dL (ref 0.40–1.20)
GFR: 56.32 mL/min — ABNORMAL LOW
Glucose, Bld: 125 mg/dL — ABNORMAL HIGH (ref 70–99)
Potassium: 3.2 meq/L — ABNORMAL LOW (ref 3.5–5.1)
Sodium: 139 meq/L (ref 135–145)

## 2024-08-20 LAB — HEMOGLOBIN A1C: Hgb A1c MFr Bld: 6.2 % (ref 4.6–6.5)

## 2024-08-20 NOTE — Telephone Encounter (Signed)
 Called patient to inform her that we needed more blood. Forward it to front desk to call and schedule a LAB only appointment closer to patients house.

## 2024-08-20 NOTE — Progress Notes (Signed)
 "  Subjective  CC:  Chief Complaint  Patient presents with   Diabetes    HPI: Carol [name] is a 58 y.o. female who presents to the office today for follow up of diabetes and problems listed above in the chief complaint.  Discussed the use of AI scribe software for clinical note transcription with the patient, who gave verbal consent to proceed.  History of Present Illness Carol [name] is a 58 year old female with diabetes who presents for follow-up on her diabetes management.  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 Checks at home and readings normal On valsartan  hct w/ potassium supplements, carvedilol  and amlodipine  Feeling well. Taking medications w/o adverse effects. No symptoms of CHF, angina; no palpitations, sob, cp or lower extremity edema. Compliant with meds.   Hair thinning ongoing for a few years now. No patchy loss. - Not on hormone therapy; has considered over-the-counter Rogaine for hair thinning   Wt Readings from Last 3 Encounters:  08/20/24 176 lb 9.6 oz (80.1 kg)  05/23/24 183 lb 9.6 oz (83.3 kg)  11/13/23 182 lb 6.4 oz (82.7 kg)    BP Readings from Last 3 Encounters:  08/20/24 110/72  05/23/24 110/72  11/13/23 126/72    Assessment  1. Type 2 diabetes mellitus with diabetic  nephropathy, without long-term current use of insulin (HCC)   2. Hypokalemia   3. Diuretic-induced hypokalemia   4. White coat syndrome with diagnosis of hypertension      Plan  Assessment and Plan Assessment & Plan Type 2 diabetes mellitus with diabetic nephropathy Diabetes management is suboptimal with A1c above 7. She has started Mounjaro  but experiences gastrointestinal side effects. Weight loss of 7 pounds since September is positive. Current Farxiga  dose is 5 mg, with consideration to increase to 10 mg for better glycemic control and kidney protection. Concerns about potassium levels due to family history of hypokalemia. - Ordered blood test to check A1c and potassium levels. - Will consider increasing Farxiga  to 10 mg if A1c remains above target. - Continue Mounjaro  at 2.5 mg if diabetes is controlled, with potential increase if needed for weight loss.   Hypertension Blood pressure is well-controlled with home readings of 110/72 mmHg. - Continue current blood pressure management regimen. - recheck potassium levels and renal function.  - consider switching back to spironolactone  from hydrochlorothiazide /potassium  CKD stable   General Health Maintenance Hair thinning possibly related to menopause and rapid weight loss. No current hormone therapy. Discussed over-the-counter options like Rogaine for hair thinning. - Consider over-the-counter treatments like Rogaine for hair thinning. - Ensure adequate protein intake to support hair health.    Follow up: 3-6 months depending upon diabetic control Orders Placed This Encounter  Procedures   Hemoglobin A1c   Basic metabolic panel with GFR   Magnesium    No orders of the defined types were placed in this encounter.  Immunization History  Administered Date(s) Administered   Influenza Whole 05/31/2018   Influenza, Seasonal, Injecte, Preservative Fre 10/24/2023, 05/23/2024   Influenza,inj,Quad PF,6+ Mos 07/10/2015,  06/13/2017, 06/13/2018, 06/04/2019, 04/29/2020, 05/28/2021, 06/17/2022   Influenza-Unspecified 06/13/2017   PFIZER(Purple Top)SARS-COV-2 Vaccination 12/14/2019, 01/04/2020, 08/28/2020   PNEUMOCOCCAL CONJUGATE-20 07/18/2022   Tdap 06/24/2015   Zoster Recombinant(Shingrix ) 02/13/2023, 05/19/2023    Diabetes Related Lab Review: Lab Results  Component Value Date   HGBA1C 7.6 (H) 05/23/2024   HGBA1C 6.0 (A) 11/13/2023   HGBA1C 6.5 (A) 08/10/2023    Lab Results  Component Value Date   MICROALBUR 0.9 05/23/2024   Lab Results  Component Value Date   CREATININE 1.11 05/23/2024   BUN 15 05/23/2024   NA 139 05/23/2024   K 3.4 (L) 05/23/2024   CL 101 05/23/2024   CO2 29 05/23/2024   Lab Results  Component Value Date   CHOL 130 05/23/2024   CHOL 130 05/19/2023   CHOL 155 02/13/2023   Lab Results  Component Value Date   HDL 35.00 (L) 05/23/2024   HDL 39.20 05/19/2023   HDL 32.50 (L) 02/13/2023   Lab Results  Component Value Date   LDLCALC 70 05/23/2024   LDLCALC 71 05/19/2023   LDLCALC 94 02/13/2023   Lab Results  Component Value Date   TRIG 125.0 05/23/2024   TRIG 102.0 05/19/2023   TRIG 142.0 02/13/2023   Lab Results  Component Value Date   CHOLHDL 4 05/23/2024   CHOLHDL 3 05/19/2023   CHOLHDL 5 02/13/2023   Lab Results  Component Value Date   LDLDIRECT 144.0 04/02/2019   The 10-year ASCVD risk score (Arnett DK, et al., 2019) is: 7.9%   Values used to calculate the score:     Age: 28 years     Clinically relevant sex: Female     Is Non-Hispanic African American: Yes     Diabetic: Yes     Tobacco smoker: No     Systolic Blood Pressure: 110 mmHg     Is BP treated: Yes     HDL Cholesterol: 35 mg/dL     Total Cholesterol: 130 mg/dL I have reviewed the PMH, Fam and Soc history. Patient Active Problem List   Diagnosis Date Noted Date Diagnosed   Type 2 diabetes mellitus with diabetic nephropathy, without long-term current use of insulin (HCC) 02/13/2023      Priority: High    Diagnosis 2024; farxiga  10    Combined hyperlipidemia associated with type 2 diabetes mellitus (HCC) 05/17/2021     Priority: High   Obesity (BMI 30-39.9) 10/23/2019     Priority: High   White coat syndrome with diagnosis of hypertension 07/18/2018     Priority: High    Dr. Raford, cards; consider changing hydrochlorothiazide  to spironolactone  due to hypokalemia.  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 No hemoglobin variant is identified. Alpha thalassemia is possible if iron deficiency and other causes of low MCV or MCH are ruled out.     Sinus tachycardia 12/13/2019     Priority: Medium    Diuretic-induced hypokalemia 07/18/2018     Priority: Medium     Hydrochlorothiazide , takes 20 mEq of potassium supplement daily.  Seasonal allergic rhinitis due to pollen 12/10/2020     Priority: Low   Menopausal vaginal dryness 07/18/2018     Priority: Low   H/O: hysterectomy 06/17/2022    Snoring 09/02/2021     Social History: Patient  reports that she has never smoked. She has never used smokeless tobacco. She reports current alcohol use. She reports that she does not use drugs.  Review of Systems: Ophthalmic: negative for eye pain, loss of vision or double vision Cardiovascular: negative for chest pain Respiratory: negative for SOB or persistent cough Gastrointestinal: negative for abdominal pain Genitourinary: negative for dysuria or gross hematuria MSK: negative for foot lesions Neurologic: negative for weakness or gait disturbance  Objective  Vitals: BP 110/72   Pulse 81   Temp 97.7 F (36.5 C) (Temporal)   Ht 5' 2 (1.575 m)   Wt 176 lb  9.6 oz (80.1 kg)   LMP 11/27/2005   SpO2 97%   BMI 32.30 kg/m  General: well appearing, no acute distress  Psych:  Alert and oriented, normal mood and affect  Diabetic education: ongoing education regarding chronic disease management for diabetes was given today. We continue to reinforce the ABC's of diabetic management: A1c (<7 or 8 dependent upon patient), tight blood pressure control, and cholesterol management with goal LDL < 100 minimally. We discuss diet strategies, exercise recommendations, medication options and possible side effects. At each visit, we review recommended immunizations and preventive care recommendations for diabetics and stress that good diabetic control can prevent other problems. See below for this patient's data. Commons side effects, risks, benefits, and alternatives for medications and treatment plan prescribed today were discussed, and the patient expressed understanding of the given instructions. Patient is instructed to call or message via MyChart if he/she has any questions or concerns regarding our treatment plan. No barriers to understanding were identified. We discussed Red Flag symptoms and signs in detail. Patient expressed understanding regarding what to do in case of urgent or emergency type symptoms.  Medication list was reconciled, printed and provided to the patient in AVS. Patient instructions and summary information was reviewed with the patient as documented in the AVS. This note was prepared with assistance of Dragon voice recognition software. Occasional wrong-word or sound-a-like substitutions may have occurred due to the inherent limitations of voice recognition software   "

## 2024-08-22 ENCOUNTER — Other Ambulatory Visit: Payer: Self-pay | Admitting: Family Medicine

## 2024-08-25 ENCOUNTER — Ambulatory Visit: Payer: Self-pay | Admitting: Family

## 2024-08-26 ENCOUNTER — Other Ambulatory Visit: Payer: Self-pay | Admitting: Family Medicine

## 2024-08-26 ENCOUNTER — Other Ambulatory Visit (HOSPITAL_BASED_OUTPATIENT_CLINIC_OR_DEPARTMENT_OTHER): Payer: Self-pay | Admitting: Family

## 2024-08-26 ENCOUNTER — Other Ambulatory Visit: Payer: Self-pay

## 2024-08-26 DIAGNOSIS — I10 Essential (primary) hypertension: Secondary | ICD-10-CM

## 2024-08-26 DIAGNOSIS — E876 Hypokalemia: Secondary | ICD-10-CM

## 2024-08-26 NOTE — Progress Notes (Signed)
 Spoke with patient directly. Expressed understanding about doubling potassium x5 days. LAB only appointment for 01/05.

## 2024-09-02 ENCOUNTER — Other Ambulatory Visit (INDEPENDENT_AMBULATORY_CARE_PROVIDER_SITE_OTHER)

## 2024-09-02 DIAGNOSIS — E876 Hypokalemia: Secondary | ICD-10-CM

## 2024-09-03 ENCOUNTER — Ambulatory Visit: Payer: Self-pay | Admitting: Family

## 2024-09-03 LAB — BASIC METABOLIC PANEL WITH GFR
BUN: 17 mg/dL (ref 6–23)
CO2: 33 meq/L — ABNORMAL HIGH (ref 19–32)
Calcium: 9.5 mg/dL (ref 8.4–10.5)
Chloride: 102 meq/L (ref 96–112)
Creatinine, Ser: 1.09 mg/dL (ref 0.40–1.20)
GFR: 56.3 mL/min — ABNORMAL LOW
Glucose, Bld: 91 mg/dL (ref 70–99)
Potassium: 3.9 meq/L (ref 3.5–5.1)
Sodium: 140 meq/L (ref 135–145)

## 2024-10-24 ENCOUNTER — Encounter (HOSPITAL_BASED_OUTPATIENT_CLINIC_OR_DEPARTMENT_OTHER): Admitting: Family

## 2024-10-28 ENCOUNTER — Ambulatory Visit (HOSPITAL_BASED_OUTPATIENT_CLINIC_OR_DEPARTMENT_OTHER): Payer: Commercial Managed Care - PPO | Admitting: Obstetrics & Gynecology

## 2024-11-19 ENCOUNTER — Ambulatory Visit (HOSPITAL_BASED_OUTPATIENT_CLINIC_OR_DEPARTMENT_OTHER): Payer: Self-pay | Admitting: Obstetrics & Gynecology
